# Patient Record
Sex: Male | Born: 1960 | Race: Black or African American | Hispanic: No | Marital: Married | Smoking: Never smoker
Health system: Southern US, Community
[De-identification: ages and names within clinical notes are randomized; demographics above are authoritative.]

## PROBLEM LIST (undated history)

## (undated) DIAGNOSIS — E119 Type 2 diabetes mellitus without complications: Secondary | ICD-10-CM

## (undated) HISTORY — DX: Type 2 diabetes mellitus without complications: E11.9

## (undated) HISTORY — PX: TONSILECTOMY, ADENOIDECTOMY, BILATERAL MYRINGOTOMY AND TUBES: SHX2538

---

## 2011-06-25 ENCOUNTER — Ambulatory Visit (INDEPENDENT_AMBULATORY_CARE_PROVIDER_SITE_OTHER): Payer: 59 | Admitting: Family Medicine

## 2011-06-25 ENCOUNTER — Encounter: Payer: Self-pay | Admitting: Family Medicine

## 2011-06-25 VITALS — BP 136/101 | HR 117 | Temp 98.0°F | Resp 20 | Ht 74.5 in | Wt 330.2 lb

## 2011-06-25 DIAGNOSIS — M109 Gout, unspecified: Secondary | ICD-10-CM | POA: Insufficient documentation

## 2011-06-25 DIAGNOSIS — E669 Obesity, unspecified: Secondary | ICD-10-CM

## 2011-06-25 DIAGNOSIS — Z130 Encounter for screening for diseases of the blood and blood-forming organs and certain disorders involving the immune mechanism: Secondary | ICD-10-CM

## 2011-06-25 DIAGNOSIS — I10 Essential (primary) hypertension: Secondary | ICD-10-CM

## 2011-06-25 DIAGNOSIS — E119 Type 2 diabetes mellitus without complications: Secondary | ICD-10-CM | POA: Insufficient documentation

## 2011-06-25 LAB — COMPREHENSIVE METABOLIC PANEL
AST: 18 U/L (ref 0–37)
Albumin: 4.6 g/dL (ref 3.5–5.2)
Alkaline Phosphatase: 64 U/L (ref 39–117)
BUN: 17 mg/dL (ref 6–23)
Creat: 0.9 mg/dL (ref 0.50–1.35)
Glucose, Bld: 302 mg/dL — ABNORMAL HIGH (ref 70–99)

## 2011-06-25 LAB — CBC
Hemoglobin: 15.4 g/dL (ref 13.0–17.0)
MCH: 27.6 pg (ref 26.0–34.0)
MCHC: 36.4 g/dL — ABNORMAL HIGH (ref 30.0–36.0)
MCV: 75.8 fL — ABNORMAL LOW (ref 78.0–100.0)
Platelets: 302 10*3/uL (ref 150–400)
RBC: 5.58 MIL/uL (ref 4.22–5.81)

## 2011-06-25 LAB — POCT GLYCOSYLATED HEMOGLOBIN (HGB A1C): Hemoglobin A1C: 10

## 2011-06-25 LAB — URIC ACID: Uric Acid, Serum: 6.7 mg/dL (ref 4.0–7.8)

## 2011-06-25 LAB — LIPID PANEL
HDL: 33 mg/dL — ABNORMAL LOW (ref >39)
LDL Cholesterol: 132 mg/dL — ABNORMAL HIGH (ref 0–99)
Total CHOL/HDL Ratio: 6.3 Ratio
Triglycerides: 221 mg/dL — ABNORMAL HIGH (ref <150)

## 2011-06-25 MED ORDER — LISINOPRIL-HYDROCHLOROTHIAZIDE 20-12.5 MG PO TABS: 1.0000 | ORAL_TABLET | Freq: Every day | ORAL | Status: DC

## 2011-06-25 MED ORDER — METFORMIN HCL 1000 MG PO TABS: 1000.0000 mg | ORAL_TABLET | Freq: Two times a day (BID) | ORAL | Status: DC

## 2011-06-25 NOTE — Patient Instructions (Signed)
Diabetes Meal Planning Guide The diabetes meal planning guide is a tool to help you plan your meals and snacks. It is important for people with diabetes to manage their blood glucose (sugar) levels. Choosing the right foods and the right amounts throughout your day will help control your blood glucose. Eating right can even help you improve your blood pressure and reach or maintain a healthy weight. CARBOHYDRATE COUNTING MADE EASY When you eat carbohydrates, they turn to sugar. This raises your blood glucose level. Counting carbohydrates can help you control this level so you feel better. When you plan your meals by counting carbohydrates, you can have more flexibility in what you eat and balance your medicine with your food intake. Carbohydrate counting simply means adding up the total amount of carbohydrate grams in your meals and snacks. Try to eat about the same amount at each meal. Foods with carbohydrates are listed below. Each portion below is 1 carbohydrate serving or 15 grams of carbohydrates. Ask your dietician how many grams of carbohydrates you should eat at each meal or snack. Grains and Starches  1 slice bread.    English muffin or hotdog/hamburger bun.    cup cold cereal (unsweetened).   ? cup cooked pasta or rice.    cup starchy vegetables (corn, potatoes, peas, beans, winter squash).   1 tortilla (6 inches).    bagel.   1 waffle or pancake (size of a CD).    cup cooked cereal.   4 to 6 small crackers.  *Whole grain is recommended. Fruit  1 cup fresh unsweetened berries, melon, papaya, pineapple.   1 small fresh fruit.    banana or mango.    cup fruit juice (4 oz unsweetened).    cup canned fruit in natural juice or water.   2 tbs dried fruit.   12 to 15 grapes or cherries.  Milk and Yogurt  1 cup fat-free or 1% milk.   1 cup soy milk.   6 oz light yogurt with sugar-free sweetener.   6 oz low-fat soy yogurt.   6 oz plain yogurt.   Vegetables  1 cup raw or  cup cooked is counted as 0 carbohydrates or a "free" food.   If you eat 3 or more servings at 1 meal, count them as 1 carbohydrate serving.  Other Carbohydrates   oz chips or pretzels.    cup ice cream or frozen yogurt.    cup sherbet or sorbet.   2 inch square cake, no frosting.   1 tbs honey, sugar, jam, jelly, or syrup.   2 small cookies.   3 squares of graham crackers.   3 cups popcorn.   6 crackers.   1 cup broth-based soup.   Count 1 cup casserole or other mixed foods as 2 carbohydrate servings.   Foods with less than 20 calories in a serving may be counted as 0 carbohydrates or a "free" food.  You may want to purchase a book or computer software that lists the carbohydrate gram counts of different foods. In addition, the nutrition facts panel on the labels of the foods you eat are a good source of this information. The label will tell you how big the serving size is and the total number of carbohydrate grams you will be eating per serving. Divide this number by 15 to obtain the number of carbohydrate servings in a portion. Remember, 1 carbohydrate serving equals 15 grams of carbohydrate. SERVING SIZES Measuring foods and serving sizes helps you   make sure you are getting the right amount of food. The list below tells how big or small some common serving sizes are.  1 oz.........4 stacked dice.   3 oz........Marland KitchenDeck of cards.   1 tsp.......Marland KitchenTip of little finger.   1 tbs......Marland KitchenMarland KitchenThumb.   2 tbs.......Marland KitchenGolf ball.    cup......Marland KitchenHalf of a fist.   1 cup.......Marland KitchenA fist.  SAMPLE DIABETES MEAL PLAN Below is a sample meal plan that includes foods from the grain and starches, dairy, vegetable, fruit, and meat groups. A dietician can individualize a meal plan to fit your calorie needs and tell you the number of servings needed from each food group. However, controlling the total amount of carbohydrates in your meal or snack is more important than  making sure you include all of the food groups at every meal. You may interchange carbohydrate containing foods (dairy, starches, and fruits). The meal plan below is an example of a 2000 calorie diet using carbohydrate counting. This meal plan has 17 carbohydrate servings. Breakfast  1 cup oatmeal (2 carb servings).    cup light yogurt (1 carb serving).   1 cup blueberries (1 carb serving).    cup almonds.  Snack  1 large apple (2 carb servings).   1 low-fat string cheese stick.  Lunch  Chicken breast salad.   1 cup spinach.    cup chopped tomatoes.   2 oz chicken breast, sliced.   2 tbs low-fat [place] dressing.   12 whole-wheat crackers (2 carb servings).   12 to 15 grapes (1 carb serving).   1 cup low-fat milk (1 carb serving).  Snack  1 cup carrots.    cup hummus (1 carb serving).  Dinner  3 oz broiled salmon.   1 cup brown rice (3 carb servings).  Snack  1  cups steamed broccoli (1 carb serving) drizzled with 1 tsp olive oil and lemon juice.   1 cup light pudding (2 carb servings).  DIABETES MEAL PLANNING WORKSHEET Your dietician can use this worksheet to help you decide how many servings of foods and what types of foods are right for you.  BREAKFAST Food Group and Servings / Carb Servings Grain/Starches __________________________________ Dairy __________________________________________ Vegetable ______________________________________ Fruit ___________________________________________ Meat __________________________________________ Fat ____________________________________________ LUNCH Food Group and Servings / Carb Servings Grain/Starches ___________________________________ Dairy ___________________________________________ Fruit ____________________________________________ Meat ___________________________________________ Fat _____________________________________________ Laural Golden Food Group and Servings / Carb Servings Grain/Starches  ___________________________________ Dairy ___________________________________________ Fruit ____________________________________________ Meat ___________________________________________ Fat _____________________________________________ SNACKS Food Group and Servings / Carb Servings Grain/Starches ___________________________________ Dairy ___________________________________________ Vegetable _______________________________________ Fruit ____________________________________________ Meat ___________________________________________ Fat _____________________________________________ DAILY TOTALS Starches _________________________ Vegetable ________________________ Fruit ____________________________ Dairy ____________________________ Meat ____________________________ Fat ______________________________ Document Released: 11/21/2004 Document Revised: 02/13/2011 Document Reviewed: 10/02/2008 [employer] Patient Information 2012 [place], [place].    Obesity Obesity is defined as having a body mass index (BMI) of 30 or more. To calculate your BMI divide your weight in pounds by your height in inches squared and multiply that product by 703. Major illnesses resulting from long-term obesity include:  Stroke.   Heart disease.   Diabetes.   Many cancers.   Arthritis.  Obesity also complicates recovery from many other medical problems.  CAUSES   A history of obesity in your parents.   Thyroid hormone imbalance.   Environmental factors such as excess calorie intake and physical inactivity.  TREATMENT  A healthy weight loss program includes:  A calorie restricted diet based on individual calorie needs.   Increased physical activity (exercise).  An exercise program is  just as important as the right low-calorie diet.  Weight-loss medicines should be used only under the supervision of your physician. These medicines help, but only if they are used with diet and exercise programs. Medicines  can have side effects including nervousness, nausea, abdominal pain, diarrhea, headache, drowsiness, and depression.  An unhealthy weight loss program includes:  Fasting.   Fad diets.   Supplements and drugs.  These choices do not succeed in long-term weight control.  HOME CARE INSTRUCTIONS  To help you make the needed dietary changes:   Exercise and perform physical activity as directed by your caregiver.   Keep a daily record of everything you eat. There are many free websites to help you with this. It may be helpful to measure your foods so you can determine if you are eating the correct portion sizes.   Use low-calorie cookbooks or take special cooking classes.   Avoid alcohol. Drink more water and drinks with no calories.   Take vitamins and supplements only as recommended by your caregiver.   Weight loss support groups, Registered Dieticians, counselors, and stress reduction education can also be very helpful.  Document Released: 04/03/2004 Document Revised: 02/13/2011 Document Reviewed: 01/31/2007 [facility] Patient Information 2012 [place], [place].

## 2011-06-28 ENCOUNTER — Encounter: Payer: Self-pay | Admitting: Family Medicine

## 2011-06-28 DIAGNOSIS — E669 Obesity, unspecified: Secondary | ICD-10-CM | POA: Insufficient documentation

## 2011-06-28 NOTE — Progress Notes (Signed)
  Subjective:    Patient ID: Spencer Woodward, male    DOB: February 13, 1961, 51 y.o.   MRN: [id number]  HPI   This 51 y.o. AA male has uncontrolled HTN and Diabetes (treated with oral agent). 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 His medications at that time were Lisinopril 10 mg  and Metformin 500 mg. Pt admits that he has not been compliant with medications and has been taking  them daily only for the last week or so. He denies CP, HA, palpitations, dizziness, numbness or weakness.    Review of Systems  Constitutional: Negative.   Respiratory: Negative for cough, chest tightness and shortness of breath.   Cardiovascular: Negative.   Musculoskeletal: Negative.   Neurological: Negative.        Objective:   Physical Exam  Vitals reviewed. Constitutional: He is oriented to person, place, and time. He appears well-developed and well-nourished. No distress.  HENT:  Head: Normocephalic and atraumatic.  Right Ear: External ear normal.  Left Ear: External ear normal.  Mouth/Throat: Oropharynx is clear and moist.  Eyes: Conjunctivae and EOM are normal. Pupils are equal, round, and reactive to light. No scleral icterus.  Neck: Normal range of motion. Neck supple. No thyromegaly present.  Cardiovascular: Regular rhythm and normal heart sounds.  Exam reveals no gallop.   No murmur heard.      Sinus tachycardia   Pulmonary/Chest: Effort normal and breath sounds normal. No respiratory distress. He has no wheezes.  Abdominal: Soft. Bowel sounds are normal. He exhibits no mass. There is no tenderness. There is no guarding.  Musculoskeletal: Normal range of motion. He exhibits no edema.  Lymphadenopathy:    He has no cervical adenopathy.  Neurological: He is alert and oriented to person, place, and time. He has normal reflexes. No cranial nerve deficit.  Skin: Skin is warm and dry.  Psychiatric: He has a normal mood and affect. His behavior is normal.       Slightly  agitated or nervous and easily distracted    Hb A1C= 10.0      Assessment & Plan:   1. HTN (hypertension)  Comprehensive metabolic panel, TSH  2. Type II or unspecified type diabetes mellitus without mention of complication, uncontrolled  Lipid panel; improve nutrition and lifestyle/ exercise habits  3. Screening for deficiency anemia  CBC  4. Gout  Uric Acid  5. Obesity, Class III, BMI 40-49.9 (morbid obesity)  Vitamin D, 25-hydroxy

## 2011-06-29 NOTE — Progress Notes (Signed)
Quick Note:  Please call pt and advise that the following labs are abnormal...  Blood sugar is high (pt is Diabetic) so important to take meds as prescribed and get with nutrition and exercise program. Vit D level is a little low; get OTC Vit D3 supplememt 1000 IU and take 1 capsule every day. Lipids are high so getting Diabetes under good control is key to getting these numbers down . HDL ("good") cholesterol in too low so regular exercise will help raise it. These numbers will be rechecked in 2-3 months.  Copy to pt. ______

## 2011-06-30 ENCOUNTER — Encounter: Payer: Self-pay | Admitting: Internal Medicine

## 2011-06-30 ENCOUNTER — Other Ambulatory Visit: Payer: Self-pay | Admitting: Internal Medicine

## 2011-06-30 DIAGNOSIS — E119 Type 2 diabetes mellitus without complications: Secondary | ICD-10-CM

## 2011-06-30 DIAGNOSIS — I10 Essential (primary) hypertension: Secondary | ICD-10-CM

## 2011-06-30 MED ORDER — LISINOPRIL-HYDROCHLOROTHIAZIDE 20-12.5 MG PO TABS: 2.0000 | ORAL_TABLET | Freq: Every day | ORAL | Status: DC

## 2011-08-27 ENCOUNTER — Encounter: Payer: Self-pay | Admitting: Family Medicine

## 2011-08-27 ENCOUNTER — Ambulatory Visit (INDEPENDENT_AMBULATORY_CARE_PROVIDER_SITE_OTHER): Payer: Self-pay | Admitting: Family Medicine

## 2011-08-27 VITALS — BP 135/93 | HR 75 | Temp 98.1°F | Resp 16 | Ht 75.0 in | Wt 322.4 lb

## 2011-08-27 DIAGNOSIS — I10 Essential (primary) hypertension: Secondary | ICD-10-CM

## 2011-08-27 DIAGNOSIS — E66813 Obesity, class 3: Secondary | ICD-10-CM

## 2011-08-27 DIAGNOSIS — IMO0001 Reserved for inherently not codable concepts without codable children: Secondary | ICD-10-CM

## 2011-08-27 DIAGNOSIS — B353 Tinea pedis: Secondary | ICD-10-CM

## 2011-08-27 NOTE — Patient Instructions (Addendum)
Athlete's Foot Athlete's foot (tinea pedis) is a fungal infection of the skin on the feet. It often occurs on the skin between the toes or underneath the toes. It can also occur on the soles of the feet. Athlete's foot is more likely to occur in hot, humid weather. Not washing your feet or changing your socks often enough can contribute to athlete's foot. The infection can spread from person to person (contagious). CAUSES Athlete's foot is caused by a fungus. This fungus thrives in warm, moist places. Most people get athlete's foot by sharing shower stalls, towels, and wet floors with an infected person. People with weakened immune systems, including those with diabetes, may be more likely to get athlete's foot. SYMPTOMS   Itchy areas between the toes or on the soles of the feet.   White, flaky, or scaly areas between the toes or on the soles of the feet.   Tiny, intensely itchy blisters between the toes or on the soles of the feet.   Tiny cuts on the skin. These cuts can develop a bacterial infection.   Thick or discolored toenails.  DIAGNOSIS  Your caregiver can usually tell what the problem is by doing a physical exam. Your caregiver may also take a skin sample from the rash area. The skin sample may be examined under a microscope, or it may be tested to see if fungus will grow in the sample. A sample may also be taken from your toenail for testing. TREATMENT  Over-the-counter and prescription medicines can be used to kill the fungus. These medicines are available as powders or creams. Your caregiver can suggest medicines for you. Fungal infections respond slowly to treatment. You may need to continue using your medicine for several weeks. PREVENTION   Do not share towels.   Wear sandals in wet areas, such as shared locker rooms and shared showers.   Keep your feet dry. Wear shoes that allow air to circulate. Wear cotton or wool socks.  HOME CARE INSTRUCTIONS   Take medicines as  directed by your caregiver. Do not use steroid creams on athlete's foot.   Keep your feet clean and cool. Wash your feet daily and dry them thoroughly, especially between your toes.   Change your socks every day. Wear cotton or wool socks. In hot climates, you may need to change your socks 2 to 3 times per day.   Wear sandals or canvas tennis shoes with good air circulation.   If you have blisters, soak your feet in Burow's solution or Epsom salts for 20 to 30 minutes, 2 times a day to dry out the blisters. Make sure you dry your feet thoroughly afterward.  SEEK MEDICAL CARE IF:   You have a fever.   You have swelling, soreness, warmth, or redness in your foot.   You are not getting better after 7 days of treatment.   You are not completely cured after 30 days.   You have any problems caused by your medicines.  MAKE SURE YOU:   Understand these instructions.   Will watch your condition.   Will get help right away if you are not doing well or get worse.  Document Released: 02/22/2000 Document Revised: 02/13/2011 Document Reviewed: 12/13/2010 [facility] Patient Information 2012 [place], [place]     .Vitamin D Deficiency  Not having enough vitamin D is called a deficiency. Your body needs this vitamin to keep your bones strong and healthy. Having too little of it can make your bones soft or  can cause other health problems.  HOME CARE  Take all vitamins, herbs, or nutrition drinks (supplements) as told by your doctor.   Have your blood tested 2 months after taking vitamins, herbs, or nutrition drinks.   Eat foods that have vitamin D. This includes:   Dairy products, cereals, or juices with added vitamin D. Check the label.   Fatty fish like salmon or trout.   Eggs.   Oysters.   Go outside for 10 to 15 minutes when the sun is shining. Do this 3 times a week. Do not do this if you have skin cancer.   Do not use tanning beds.   Stay at a healthy weight. Lose weight if  needed.   Keep all doctor visits as told.  GET HELP IF:  You have questions.   You continue to have problems.   You feel sick to your stomach (nauseous) or throw up (vomit).   You cannot go poop (constipated).   You feel confused.   You have severe belly (abdominal) or back pain.  MAKE SURE YOU:  Understand these instructions.   Will watch your condition.   Will get help right away if you are not doing well or get worse.  Document Released: 02/13/2011 Document Reviewed: 02/11/2011 [facility] Patient Information 2012 [place], [place].    Get over-the-counter Vitamin D 2000 IU   Take 1 capsule daily and continue daily sun exposure

## 2011-08-27 NOTE — Progress Notes (Signed)
  Subjective:    Patient ID: Peggye Form, male    DOB: 11-17-1960, 51 y.o.   MRN: [id number]  HPI   This pt is Type II Diabetic who has not been compliant with medications. He was advised to increase  Lisinopril- HCTZ to 2 tabs every AM; he has been taking it 2x daily and often missing 2nd dose as well as    missing 2nd dose of Metformin. He is managing nutrition better and has lost 8 pounds. He has no symptoms  of hypoglycemia. No FSBS daily. Denies polyuria or polydipsia. No paresthesias in hands or feet.  Does have scaly itchy rash on both feet; took Benadryl without relief. No topicals.    Review of Systems  Constitutional: Negative.   Eyes: Negative.   Respiratory: Negative for cough, chest tightness and shortness of breath.   Cardiovascular: Negative.   Neurological: Negative for dizziness, weakness, numbness and headaches.       Objective:   Physical Exam  Nursing note and vitals reviewed. Constitutional: He is oriented to person, place, and time. He appears well-developed and well-nourished. No distress.  HENT:  Head: Normocephalic.  Right Ear: External ear normal.  Mouth/Throat: Oropharynx is clear and moist.  Eyes: Conjunctivae and EOM are normal. Pupils are equal, round, and reactive to light. No scleral icterus.  Neck: Neck supple. No thyromegaly present.  Cardiovascular: Normal rate, regular rhythm and normal heart sounds.   Pulmonary/Chest: Breath sounds normal. No respiratory distress.  Musculoskeletal: Normal range of motion. He exhibits no edema and no tenderness.  Neurological: He is alert and oriented to person, place, and time. No cranial nerve deficit. Coordination normal.  Skin: Skin is warm. Rash noted.       Bilat feet: scaly rash on plantar aspect and sides of feet with dried papules          Assessment & Plan:   1. Type II or unspecified type diabetes mellitus without mention of complication, uncontrolled - Reminded to take Metformin twice daily  without fail; Good nutrition important  2. HTN, goal below 130/80: emphasized compliance with medication- take both tabs every AM so  doses not missed  3. Obesity, Class III, BMI 40-49.9 (morbid obesity)    Encouraged continued weight reduction- pt's goal is another 10 pounds by next visit  4. Tinea pedis     Get OTC product for "Athlete's foot"; foot care discussed

## 2011-09-02 ENCOUNTER — Emergency Department: Payer: Self-pay | Admitting: Emergency Medicine

## 2011-09-02 LAB — CBC
HGB: 13.5 g/dL (ref 13.0–18.0)
MCH: 27.6 pg (ref 26.0–34.0)
Platelet: 267 10*3/uL (ref 150–440)
WBC: 11.3 10*3/uL — ABNORMAL HIGH (ref 3.8–10.6)

## 2011-09-02 LAB — COMPREHENSIVE METABOLIC PANEL
Albumin: 4 g/dL (ref 3.4–5.0)
Alkaline Phosphatase: 84 U/L (ref 50–136)
Anion Gap: 8 (ref 7–16)
BUN: 20 mg/dL — ABNORMAL HIGH (ref 7–18)
Calcium, Total: 8.9 mg/dL (ref 8.5–10.1)
Chloride: 103 mmol/L (ref 98–107)
Co2: 26 mmol/L (ref 21–32)
EGFR (African American): >60
Osmolality: 281 (ref 275–301)
SGOT(AST): 38 U/L — ABNORMAL HIGH (ref 15–37)

## 2011-10-29 ENCOUNTER — Ambulatory Visit: Payer: 59 | Admitting: Family Medicine

## 2011-11-23 ENCOUNTER — Other Ambulatory Visit: Payer: Self-pay | Admitting: Internal Medicine

## 2011-12-16 ENCOUNTER — Emergency Department: Payer: Self-pay | Admitting: Emergency Medicine

## 2011-12-16 LAB — COMPREHENSIVE METABOLIC PANEL
Albumin: 3.8 g/dL (ref 3.4–5.0)
Anion Gap: 12 (ref 7–16)
Bilirubin,Total: 0.5 mg/dL (ref 0.2–1.0)
Calcium, Total: 8.9 mg/dL (ref 8.5–10.1)
Chloride: 101 mmol/L (ref 98–107)
Co2: 24 mmol/L (ref 21–32)
Creatinine: 0.98 mg/dL (ref 0.60–1.30)
EGFR (African American): >60
EGFR (Non-African Amer.): >60
Osmolality: 297 (ref 275–301)
Sodium: 137 mmol/L (ref 136–145)

## 2011-12-16 LAB — URINALYSIS, COMPLETE
Bacteria: NONE SEEN
Bilirubin,UR: NEGATIVE
Blood: NEGATIVE
Glucose,UR: >=500 mg/dL (ref 0–75)
Leukocyte Esterase: NEGATIVE
Nitrite: NEGATIVE
Ph: 5 (ref 4.5–8.0)
Specific Gravity: 1.028 (ref 1.003–1.030)
WBC UR: <1 /HPF (ref 0–5)

## 2011-12-16 LAB — CBC
HCT: 43.9 % (ref 40.0–52.0)
HGB: 15.3 g/dL (ref 13.0–18.0)
MCH: 27.6 pg (ref 26.0–34.0)
MCHC: 34.9 g/dL (ref 32.0–36.0)
Platelet: 214 10*3/uL (ref 150–440)
RDW: 13.2 % (ref 11.5–14.5)

## 2012-04-02 ENCOUNTER — Telehealth: Payer: Self-pay

## 2012-04-02 ENCOUNTER — Ambulatory Visit (INDEPENDENT_AMBULATORY_CARE_PROVIDER_SITE_OTHER): Payer: Self-pay | Admitting: Emergency Medicine

## 2012-04-02 VITALS — BP 181/120 | HR 86 | Temp 97.9°F | Resp 16 | Ht 75.5 in | Wt 330.0 lb

## 2012-04-02 DIAGNOSIS — Z9119 Patient's noncompliance with other medical treatment and regimen: Secondary | ICD-10-CM

## 2012-04-02 DIAGNOSIS — IMO0001 Reserved for inherently not codable concepts without codable children: Secondary | ICD-10-CM

## 2012-04-02 DIAGNOSIS — E782 Mixed hyperlipidemia: Secondary | ICD-10-CM

## 2012-04-02 DIAGNOSIS — Z91199 Patient's noncompliance with other medical treatment and regimen due to unspecified reason: Secondary | ICD-10-CM

## 2012-04-02 DIAGNOSIS — E1165 Type 2 diabetes mellitus with hyperglycemia: Secondary | ICD-10-CM

## 2012-04-02 DIAGNOSIS — I10 Essential (primary) hypertension: Secondary | ICD-10-CM

## 2012-04-02 LAB — POCT GLYCOSYLATED HEMOGLOBIN (HGB A1C)

## 2012-04-02 MED ORDER — LISINOPRIL-HYDROCHLOROTHIAZIDE 20-12.5 MG PO TABS: 2.0000 | ORAL_TABLET | Freq: Every day | ORAL | Status: DC

## 2012-04-02 MED ORDER — ATORVASTATIN CALCIUM 20 MG PO TABS: 20.0000 mg | ORAL_TABLET | Freq: Every day | ORAL | Status: DC

## 2012-04-02 MED ORDER — LOVASTATIN 20 MG PO TABS: 20.0000 mg | ORAL_TABLET | Freq: Every day | ORAL | Status: DC

## 2012-04-02 MED ORDER — METFORMIN HCL ER (OSM) 1000 MG PO TB24: 2000.0000 mg | ORAL_TABLET | Freq: Every day | ORAL | Status: DC

## 2012-04-02 MED ORDER — METFORMIN HCL ER (OSM) 500 MG PO TB24: ORAL_TABLET | ORAL | Status: DC

## 2012-04-02 NOTE — Telephone Encounter (Signed)
Pharmacist reports that Metformin ER 500 mg is a lot less expensive and requested change to 4 tablets of the 500 mg Q am. Also Lovastatin is a lot less expensive than Lipitor 20. I asked Dr Dareen Piano if it is all right to make these changes and he approved. Gave pharmacist order to change both and entering in EPIC for the records. Notified pt of changes.

## 2012-04-02 NOTE — Progress Notes (Signed)
[facility] [address], [place] [address] [phone]  Date:  04/02/2012   Name:  Spencer Woodward   DOB:  09-24-1960   MRN:  [id number]  PCP:  No primary provider on file.    Chief Complaint: Medication Refill and Follow-up   History of Present Illness:  [name] is a 52 y.o. very pleasant male patient who presents with the following:  Non compliant type II diabetic with hypertension and hyperglycemia.  Ran out of antihypertensives two weeks ago and took his last metformin today.  Not fasting, ate breakfast at 0500.  Denies being symptomatic.    Patient Active Problem List  Diagnosis  . HTN (hypertension)  . Type II or unspecified type diabetes mellitus without mention of complication, uncontrolled  . Gout  . Obesity    Past Medical History  Diagnosis Date  . Diabetes mellitus without complication     History reviewed. No pertinent past surgical history.  History  Substance Use Topics  . Smoking status: Never Smoker   . Smokeless tobacco: Not on file  . Alcohol Use: No     Comment: social    Family History  Problem Relation Age of Onset  . Diabetes Mother   . Heart disease Father   . Alcohol abuse Father     No Known Allergies  Medication list has been reviewed and updated.  Current Outpatient Prescriptions on File Prior to Visit  Medication Sig Dispense Refill  . lisinopril-hydrochlorothiazide (PRINZIDE,ZESTORETIC) 20-12.5 MG per tablet Take 2 tablets by mouth daily.  60 tablet  3  . metformin (FORTAMET) 1000 MG (OSM) 24 hr tablet TAKE 2 TABLETS (1000 MG) BY MOUTH TWICE DAILY  120 tablet  0  . metFORMIN (GLUCOPHAGE) 1000 MG tablet Take 1 tablet (1,000 mg total) by mouth 2 (two) times daily with a meal.  60 tablet  3  . Multiple Vitamin (MULTIVITAMIN) tablet Take 1 tablet by mouth daily.        Review of Systems:  As per HPI, otherwise negative.    Physical Examination: Filed Vitals:   04/02/12 1301  BP: 181/120  Pulse:  86  Temp: 97.9 F (36.6 C)  Resp: 16   Filed Vitals:   04/02/12 1301  Height: 6' 3.5" (1.918 m)  Weight: 330 lb (149.687 kg)   Body mass index is 40.70 kg/(m^2). Ideal Body Weight: Weight in (lb) to have BMI = 25: 202.3   GEN: WDWN, NAD, Non-toxic, A & O x 3 HEENT: Atraumatic, Normocephalic. Neck supple. No masses, No LAD. Ears and Nose: No external deformity. CV: RRR, No M/G/R. No JVD. No thrill. No extra heart sounds. PULM: CTA B, no wheezes, crackles, rhonchi. No retractions. No resp. distress. No accessory muscle use. ABD: S, NT, ND, +BS. No rebound. No HSM. EXTR: No c/c/e NEURO Normal gait.  PSYCH: Normally interactive. Conversant. Not depressed or anxious appearing.  Calm demeanor.    Assessment and Plan: Hypertension NIDDM Hyperlipidemia hypotestosteronism Add victoza Add androgel Follow up in two weeks  Carmelina Dane, MD

## 2012-04-03 LAB — LIPID PANEL
LDL Cholesterol: 115 mg/dL — ABNORMAL HIGH (ref 0–99)
VLDL: 35 mg/dL (ref 0–40)

## 2012-04-03 LAB — COMPREHENSIVE METABOLIC PANEL
ALT: 34 U/L (ref 0–53)
Albumin: 4.5 g/dL (ref 3.5–5.2)
Alkaline Phosphatase: 61 U/L (ref 39–117)
Potassium: 4.3 mEq/L (ref 3.5–5.3)
Sodium: 142 mEq/L (ref 135–145)
Total Bilirubin: 0.4 mg/dL (ref 0.3–1.2)
Total Protein: 7.7 g/dL (ref 6.0–8.3)

## 2012-04-03 LAB — TESTOSTERONE: Testosterone: 219.52 ng/dL — ABNORMAL LOW (ref 300–890)

## 2012-04-03 LAB — PSA: PSA: 0.83 ng/mL (ref <=4.00)

## 2012-04-03 MED ORDER — TESTOSTERONE 20.25 MG/1.25GM (1.62%) TD GEL: 2.0000 "application " | Freq: Every day | TRANSDERMAL | Status: DC

## 2012-04-06 ENCOUNTER — Telehealth: Payer: Self-pay

## 2012-04-06 NOTE — Telephone Encounter (Signed)
Patient went to [facility] to pick up rx but it costs $400 he was hoping if he can get a generic brand for less because he has no insurance. Please call back if possible.

## 2012-04-07 NOTE — Telephone Encounter (Signed)
Called patient, there is not a generic form of testosterone gel. He may want to apply for financial assistance through the drug company. He will do this.

## 2012-05-15 ENCOUNTER — Ambulatory Visit (INDEPENDENT_AMBULATORY_CARE_PROVIDER_SITE_OTHER): Payer: Self-pay | Admitting: Family Medicine

## 2012-05-15 VITALS — BP 120/80 | HR 111 | Temp 97.5°F | Resp 18 | Ht 74.0 in | Wt 310.6 lb

## 2012-05-15 DIAGNOSIS — Z79899 Other long term (current) drug therapy: Secondary | ICD-10-CM

## 2012-05-15 DIAGNOSIS — E785 Hyperlipidemia, unspecified: Secondary | ICD-10-CM | POA: Insufficient documentation

## 2012-05-15 DIAGNOSIS — I10 Essential (primary) hypertension: Secondary | ICD-10-CM

## 2012-05-15 DIAGNOSIS — IMO0001 Reserved for inherently not codable concepts without codable children: Secondary | ICD-10-CM

## 2012-05-15 MED ORDER — METFORMIN HCL 1000 MG PO TABS: 1000.0000 mg | ORAL_TABLET | Freq: Two times a day (BID) | ORAL | Status: AC

## 2012-05-15 MED ORDER — GLYBURIDE 5 MG PO TABS: 5.0000 mg | ORAL_TABLET | Freq: Two times a day (BID) | ORAL | Status: DC

## 2012-05-15 MED ORDER — LISINOPRIL-HYDROCHLOROTHIAZIDE 20-12.5 MG PO TABS: 2.0000 | ORAL_TABLET | Freq: Every day | ORAL | Status: DC

## 2012-05-15 MED ORDER — LOVASTATIN 20 MG PO TABS: 20.0000 mg | ORAL_TABLET | Freq: Every day | ORAL | Status: DC

## 2012-05-15 NOTE — Patient Instructions (Signed)
Diabetes, Eating Away From Home Sometimes, you might eat in a restaurant or have meals that are prepared by someone else. You can enjoy eating out. However, the portions in restaurants may be much larger than needed. Listed below are some ideas to help you choose foods that will keep your blood glucose (sugar) in better control.  TIPS FOR EATING OUT  Know your meal plan and how many carbohydrate servings you should have at each meal. You may wish to carry a copy of your meal plan in your purse or wallet. Learn the foods included in each food group.  Make a list of restaurants near you that offer healthy choices. Take a copy of the carry-out menus to see what they offer. Then, you can plan what you will order ahead of time.  Become familiar with serving sizes by practicing them at home using measuring cups and spoons. Once you learn to recognize portion sizes, you will be able to correctly estimate the amount of total carbohydrate you are allowed to eat at the restaurant. Ask for a takeout box if the portion is more than you should have. When your food comes, leave the amount you should have on the plate, and put the rest in the takeout box before you start eating.  Plan ahead if your mealtime will be different from usual. Check with your caregiver to find out how to time meals and medicine if you are taking insulin.  Avoid high-fat foods, such as fried foods, cream sauces, high-fat salad dressings, or any added butter or margarine.  Do not be afraid to ask questions. Ask your server about the portion size, cooking methods, ingredients and if items can be substituted. Restaurants do not list all available items on the menu. You can ask for your main entree to be prepared using skim milk, oil instead of butter or margarine, and without gravy or sauces. Ask your waiter or waitress to serve salad dressings, gravy, sauces, margarine, and sour cream on the side. You can then add the amount your meal plan  suggests.  Add more vegetables whenever possible.  Avoid items that are labeled "jumbo," "giant," "deluxe," or "supersized."  You may want to split an entre with someone and order an extra side salad.  Watch for hidden calories in foods like croutons, bacon, or cheese.  Ask your server to take away the bread basket or chips from your table.  Order a dinner salad as an appetizer. You can eat most foods served in a restaurant. Some foods are better choices than others. Breads and Starches  Recommended: All kinds of bread (wheat, rye, white, oatmeal, Italian, French, raisin), hard or soft dinner rolls, frankfurter or hamburger buns, small bagels, small corn or whole-wheat flour tortillas.  Avoid: Frosted or glazed breads, butter rolls, egg or cheese breads, croissants, sweet rolls, pastries, coffee cake, glazed or frosted doughnuts, muffins. Crackers  Recommended: Animal crackers, graham, rye, saltine, oyster, and matzoth crackers. Bread sticks, melba toast, rusks, pretzels, popcorn (without fat), zwieback toast.  Avoid: High-fat snack crackers or chips. Buttered popcorn. Cereals  Recommended: Hot and cold cereals. Whole grains such as oatmeal or shredded wheat are good choices.  Avoid: Sugar-coated or granola type cereals. Potatoes/Pasta/Rice/Beans  Recommended: Order baked, boiled, or mashed potatoes, rice or noodles without added fat, whole beans. Order gravies, butter, margarine, or sauces on the side so you can control the amount you add.  Avoid: Hash browns or fried potatoes. Potatoes, pasta, or rice prepared with cream or   cheese sauce. Potato or pasta salads prepared with large amounts of dressing. Fried beans or fried rice. Vegetables  Recommended: Order steamed, baked, boiled, or stewed vegetables without sauces or extra fat. Ask that sauce be served on the side. If vegetables are not listed on the menu, ask what is available.  Avoid: Vegetables prepared with cream,  butter, or cheese sauce. Fried vegetables. Salad Bars  Recommended: Many of the vegetables at a salad bar are considered "free." Use lemon juice, vinegar, or low-calorie salad dressing (fewer than 20 calories per serving) as "free" dressings for your salad. Look for salad bar ingredients that have no added fat or sugar such as tomatoes, lettuce, cucumbers, broccoli, carrots, onions, and mushrooms.  Avoid: Prepared salads with large amounts of dressing, such as coleslaw, caesar salad, macaroni salad, bean salad, or carrot salad. Fruit  Recommended: Eat fresh fruit or fresh fruit salad without added dressing. A salad bar often offers fresh fruit choices, but canned fruit at a restaurant is usually packed in sugar or syrup.  Avoid: Sweetened canned or frozen fruits, plain or sweetened fruit juice. Fruit salads with dressing, sour cream, or sugar added to them. Meat and Meat Substitutes  Recommended: Order broiled, baked, roasted, or grilled meat, poultry, or fish. Trim off all visible fat. Do not eat the skin of poultry. The size stated on the menu is the raw weight. Meat shrinks by  in cooking (for example, 4 oz raw equals 3 oz cooked meat).  Avoid: Deep-fat fried meat, poultry, or fish. Breaded meats. Eggs  Recommended: Order soft, hard-cooked, poached, or scrambled eggs. Omelets may be okay, depending on what ingredients are added. Egg substitutes are also a good choice.  Avoid: Fried eggs, eggs prepared with cream or cheese sauce. Milk  Recommended: Order low-fat or fat-free milk according to your meal plan. Plain, nonfat yogurt or flavored yogurt with no sugar added may be used as a substitute for milk. Soy milk may also be used.  Avoid: Milk shakes or sweetened milk beverages. Soups and Combination Foods  Recommended: Clear broth or consomm are "free" foods and may be used as an appetizer. Broth-based soups with fat removed count as a starch serving and are preferred over cream  soups. Soups made with beans or split peas may be eaten but count as a starch.  Avoid: Fatty soups, soup made with cream, cheese soup. Combination foods prepared with excessive amounts of fat or with cream or cheese sauces. Desserts and Sweets  Recommended: Ask for fresh fruit. Sponge or angel food cake without icing, ice milk, no sugar added ice cream, sherbet, or frozen yogurt may fit into your meal plan occasionally.  Avoid: Pastries, puddings, pies, cakes with icing, custard, gelatin desserts. Fats and Oils  Recommended: Choose healthy fats such as olive oil, canola oil, or tub margarine, reduced fat or fat-free sour cream, cream cheese, avocado, or nuts.  Avoid: Any fats in excess of your allowed portion. Deep-fried foods or any food with a large amount of fat. Note: Ask for all fats to be served on the side, and limit your portion sizes according to your meal plan. Document Released: 02/24/2005 Document Revised: 05/19/2011 Document Reviewed: 09/14/2008 [facility] Patient Information 2013 [place], [place]. Diabetes Meal Planning Guide The diabetes meal planning guide is a tool to help you plan your meals and snacks. It is important for people with diabetes to manage their blood glucose (sugar) levels. Choosing the right foods and the right amounts throughout your day will help  control your blood glucose. Eating right can even help you improve your blood pressure and reach or maintain a healthy weight. CARBOHYDRATE COUNTING MADE EASY When you eat carbohydrates, they turn to sugar. This raises your blood glucose level. Counting carbohydrates can help you control this level so you feel better. When you plan your meals by counting carbohydrates, you can have more flexibility in what you eat and balance your medicine with your food intake. Carbohydrate counting simply means adding up the total amount of carbohydrate grams in your meals and snacks. Try to eat about the same amount at each meal. Foods  with carbohydrates are listed below. Each portion below is 1 carbohydrate serving or 15 grams of carbohydrates. Ask your dietician how many grams of carbohydrates you should eat at each meal or snack. Grains and Starches  1 slice bread.   English muffin or hotdog/hamburger bun.   cup cold cereal (unsweetened).   cup cooked pasta or rice.   cup starchy vegetables (corn, potatoes, peas, beans, winter squash).  1 tortilla (6 inches).   bagel.  1 waffle or pancake (size of a CD).   cup cooked cereal.  4 to 6 small crackers. *Whole grain is recommended. Fruit  1 cup fresh unsweetened berries, melon, papaya, pineapple.  1 small fresh fruit.   banana or mango.   cup fruit juice (4 oz unsweetened).   cup canned fruit in natural juice or water.  2 tbs dried fruit.  12 to 15 grapes or cherries. Milk and Yogurt  1 cup fat-free or 1% milk.  1 cup soy milk.  6 oz light yogurt with sugar-free sweetener.  6 oz low-fat soy yogurt.  6 oz plain yogurt. Vegetables  1 cup raw or  cup cooked is counted as 0 carbohydrates or a "free" food.  If you eat 3 or more servings at 1 meal, count them as 1 carbohydrate serving. Other Carbohydrates   oz chips or pretzels.   cup ice cream or frozen yogurt.   cup sherbet or sorbet.  2 inch square cake, no frosting.  1 tbs honey, sugar, jam, jelly, or syrup.  2 small cookies.  3 squares of graham crackers.  3 cups popcorn.  6 crackers.  1 cup broth-based soup.  Count 1 cup casserole or other mixed foods as 2 carbohydrate servings.  Foods with less than 20 calories in a serving may be counted as 0 carbohydrates or a "free" food. You may want to purchase a book or computer software that lists the carbohydrate gram counts of different foods. In addition, the nutrition facts panel on the labels of the foods you eat are a good source of this information. The label will tell you how big the serving size is and the  total number of carbohydrate grams you will be eating per serving. Divide this number by 15 to obtain the number of carbohydrate servings in a portion. Remember, 1 carbohydrate serving equals 15 grams of carbohydrate. SERVING SIZES Measuring foods and serving sizes helps you make sure you are getting the right amount of food. The list below tells how big or small some common serving sizes are.  1 oz.........4 stacked dice.  3 oz........Marland KitchenDeck of cards.  1 tsp.......Marland KitchenTip of little finger.  1 tbs......Marland KitchenMarland KitchenThumb.  2 tbs.......Marland KitchenGolf ball.   cup......Marland KitchenHalf of a fist.  1 cup.......Marland KitchenA fist. SAMPLE DIABETES MEAL PLAN Below is a sample meal plan that includes foods from the grain and starches, dairy, vegetable, fruit, and meat groups. A dietician  can individualize a meal plan to fit your calorie needs and tell you the number of servings needed from each food group. However, controlling the total amount of carbohydrates in your meal or snack is more important than making sure you include all of the food groups at every meal. You may interchange carbohydrate containing foods (dairy, starches, and fruits). The meal plan below is an example of a 2000 calorie diet using carbohydrate counting. This meal plan has 17 carbohydrate servings. Breakfast  1 cup oatmeal (2 carb servings).   cup light yogurt (1 carb serving).  1 cup blueberries (1 carb serving).   cup almonds. Snack  1 large apple (2 carb servings).  1 low-fat string cheese stick. Lunch  Chicken breast salad.  1 cup spinach.   cup chopped tomatoes.  2 oz chicken breast, sliced.  2 tbs low-fat [place] dressing.  12 whole-wheat crackers (2 carb servings).  12 to 15 grapes (1 carb serving).  1 cup low-fat milk (1 carb serving). Snack  1 cup carrots.   cup hummus (1 carb serving). Dinner  3 oz broiled salmon.  1 cup brown rice (3 carb servings). Snack  1  cups steamed broccoli (1 carb serving) drizzled with 1  tsp olive oil and lemon juice.  1 cup light pudding (2 carb servings). DIABETES MEAL PLANNING WORKSHEET Your dietician can use this worksheet to help you decide how many servings of foods and what types of foods are right for you.  BREAKFAST Food Group and Servings / Carb Servings Grain/Starches __________________________________ Dairy __________________________________________ Vegetable ______________________________________ Fruit ___________________________________________ Meat __________________________________________ Fat ____________________________________________ LUNCH Food Group and Servings / Carb Servings Grain/Starches ___________________________________ Dairy ___________________________________________ Fruit ____________________________________________ Meat ___________________________________________ Fat _____________________________________________ Spencer Woodward Food Group and Servings / Carb Servings Grain/Starches ___________________________________ Dairy ___________________________________________ Fruit ____________________________________________ Meat ___________________________________________ Fat _____________________________________________ SNACKS Food Group and Servings / Carb Servings Grain/Starches ___________________________________ Dairy ___________________________________________ Vegetable _______________________________________ Fruit ____________________________________________ Meat ___________________________________________ Fat _____________________________________________ DAILY TOTALS Starches _________________________ Vegetable ________________________ Fruit ____________________________ Dairy ____________________________ Meat ____________________________ Fat ______________________________ Document Released: 11/21/2004 Document Revised: 05/19/2011 Document Reviewed: 10/02/2008 [employer] Patient Information 2013 [place], [place]. Diabetes and Standards  of Medical Care  Diabetes is complicated. You may find that your diabetes team includes a dietitian, nurse, diabetes educator, eye doctor, and more. To help everyone know what is going on and to help you get the care you deserve, the following schedule of care was developed to help keep you on track. Below are the tests, exams, vaccines, medicines, education, and plans you will need. A1c test  Performed at least 2 times a year if you are meeting treatment goals.  Performed 4 times a year if therapy has changed or if you are not meeting therapy/glycemic goals. Aspirin medicine  Take daily as directed by your caregiver. Blood pressure test  Performed at every routine medical visit. The goal is less than 130/80 mm/Hg. Dental exam  Get a dental exam at least 2 times a year. Dilated eye exam (retinal exam)  Type 1 diabetes: Get an exam within 5 years of diagnosis and then yearly.  Type 2 diabetes: Get an exam at diagnosis and then yearly. All exams thereafter can be extended to every 2 to 3 years if one or more exams have been normal. Foot care exam  Visual foot exams are performed at every routine medical visit. The exams check for cuts, injuries, or other problems with the feet.  A comprehensive foot exam should be done yearly. This includes visual inspection as well as assessing  foot pulses and testing for loss of sensation. Kidney function test (urine microalbumin)  Performed once a year.  Type 1 diabetes: The first test is performed 5 years after diagnosis.  Type 2 diabetes: The first test is performed at the time of diagnosis.  A serum creatinine and estimated glomerular filtration rate (eGFR) test is done once a year to tell the level of chronic kidney disease (CKD), if present. Lipid profile (Cholesterol, HDL, LDL, Triglycerides)  Performed once a year for most people. If at low risk, may be assessed every 2 years.  The goal for LDL is less than 100 mg/dl. If at high risk,  the goal is less than 70 mg/dl.  The goal for HDL is higher than 40 mg/dl for men and higher than 50 mg/dl for women.  The goal for triglycerides is less than 150 mg/dl. Flu vaccine, pneumonia vaccine, and hepatitis B vaccine  The flu vaccine is recommended yearly.  The pneumonia vaccine is generally given once in a lifetime. However, there are some instances where another vaccine is recommended. Check with your caregiver.  The hepatitis B vaccine is also recommended for adults with diabetes. Diabetes self-management education  Recommended at diagnosis and ongoing as needed. Treatment plan  Reviewed at every medical visit. Document Released: 12/22/2008 Document Revised: 05/19/2011 Document Reviewed: 08/27/2010 [facility] Patient Information 2013 [place], [place].

## 2012-05-15 NOTE — Progress Notes (Signed)
Subjective:    Patient ID: Spencer Woodward, male    DOB: May 30, 1960, 52 y.o.   MRN: [id number] Chief Complaint  Patient presents with  . blood sugar check    urinary frequency, thirsty    HPI  For the past 2 wks he has had severe dry mouth and urinary freq.  He has been on glyburide in the past but was always able to go off of it and just have his DM controlled on metformin eventually. He has been taking his metformin but does not check cbgs at home. No health insurance so tries to minimize testing and visits.  Past Medical History  Diagnosis Date  . Diabetes mellitus without complication    Current Outpatient Prescriptions on File Prior to Visit  Medication Sig Dispense Refill  . Multiple Vitamin (MULTIVITAMIN) tablet Take 1 tablet by mouth daily.      . Testosterone (ANDROGEL) 20.25 MG/1.25GM (1.62%) GEL Place 2 application onto the skin daily. Please change application to pump actuations.  2.5 g  5   No current facility-administered medications on file prior to visit.   No Known Allergies   Review of Systems  Constitutional: Positive for appetite change and fatigue.  Respiratory: Negative for shortness of breath.   Cardiovascular: Negative for chest pain and leg swelling.  Endocrine: Positive for polydipsia, polyphagia and polyuria. Negative for cold intolerance and heat intolerance.  Genitourinary: Positive for frequency. Negative for dysuria and difficulty urinating.  Skin: Negative for rash.  Neurological: Negative for weakness and numbness.      BP 120/80  Pulse 111  Temp(Src) 97.5 F (36.4 C) (Oral)  Resp 18  Ht 6\' 2"  (1.88 m)  Wt 310 lb 9.6 oz (140.887 kg)  BMI 39.86 kg/m2  SpO2 97% Objective:   Physical Exam  Constitutional: He is oriented to person, place, and time. He appears well-developed and well-nourished. No distress.  HENT:  Head: Normocephalic and atraumatic.  Eyes: Conjunctivae are normal. Pupils are equal, round, and reactive to light. No scleral  icterus.  Neck: Normal range of motion. Neck supple. No thyromegaly present.  Cardiovascular: Normal rate, regular rhythm, normal heart sounds and intact distal pulses.   Pulmonary/Chest: Effort normal and breath sounds normal. No respiratory distress.  Musculoskeletal: He exhibits no edema.  Lymphadenopathy:    He has no cervical adenopathy.  Neurological: He is alert and oriented to person, place, and time.  Skin: Skin is warm and dry. He is not diaphoretic.  Psychiatric: He has a normal mood and affect. His behavior is normal.      Results for orders placed in visit on 05/15/12  GLUCOSE, POCT (MANUAL RESULT ENTRY)      Result Value Range   POC Glucose HHH >444  70 - 99 mg/dl    Assessment & Plan:  Type II or unspecified type diabetes mellitus without mention of complication, uncontrolled - Plan: POCT glucose (manual entry) - continue max dose metformin and start glyburide.  Monitor cbgs due to possibility for hypoglycemia (though unlikely). Work on diet/exercise. If cont to have high cbgs, f/u immed.  HTN (hypertension)  Other and unspecified hyperlipidemia  Encounter for long-term (current) use of other medications  Meds ordered this encounter  Medications  . DISCONTD: glyBURIDE (DIABETA) 5 MG tablet    Sig: Take 5 mg by mouth 2 (two) times daily with a meal.  . penicillin v potassium (VEETID) 500 MG tablet    Sig: Take 500 mg by mouth 4 (four) times daily.  Marland Kitchen  metFORMIN (GLUCOPHAGE) 1000 MG tablet    Sig: Take 1 tablet (1,000 mg total) by mouth 2 (two) times daily with a meal.    Dispense:  180 tablet    Refill:  1  . glyBURIDE (DIABETA) 5 MG tablet    Sig: Take 1 tablet (5 mg total) by mouth 2 (two) times daily with a meal.    Dispense:  180 tablet    Refill:  1  . lisinopril-hydrochlorothiazide (PRINZIDE,ZESTORETIC) 20-12.5 MG per tablet    Sig: Take 2 tablets by mouth daily.    Dispense:  180 tablet    Refill:  0  . lovastatin (MEVACOR) 20 MG tablet    Sig: Take 1  tablet (20 mg total) by mouth at bedtime.    Dispense:  90 tablet    Refill:  0

## 2014-03-21 ENCOUNTER — Ambulatory Visit: Payer: Self-pay

## 2014-07-13 ENCOUNTER — Ambulatory Visit
Admission: RE | Admit: 2014-07-13 | Discharge: 2014-07-13 | Disposition: A | Discharge status: Home / Self Care | Payer: No Typology Code available for payment source | Source: Ambulatory Visit | Attending: *Deleted | Admitting: *Deleted

## 2016-04-22 ENCOUNTER — Other Ambulatory Visit (HOSPITAL_BASED_OUTPATIENT_CLINIC_OR_DEPARTMENT_OTHER): Payer: Self-pay

## 2016-04-22 DIAGNOSIS — R5383 Other fatigue: Secondary | ICD-10-CM

## 2016-04-22 DIAGNOSIS — R0683 Snoring: Secondary | ICD-10-CM

## 2016-05-13 ENCOUNTER — Ambulatory Visit (HOSPITAL_BASED_OUTPATIENT_CLINIC_OR_DEPARTMENT_OTHER): Payer: No Typology Code available for payment source | Attending: Internal Medicine

## 2017-01-06 ENCOUNTER — Ambulatory Visit (INDEPENDENT_AMBULATORY_CARE_PROVIDER_SITE_OTHER): Payer: BLUE CROSS/BLUE SHIELD | Admitting: Urology

## 2017-01-06 ENCOUNTER — Encounter: Payer: Self-pay | Admitting: Urology

## 2017-01-06 VITALS — BP 144/91 | HR 78 | Ht 74.0 in | Wt 339.3 lb

## 2017-01-06 DIAGNOSIS — N521 Erectile dysfunction due to diseases classified elsewhere: Secondary | ICD-10-CM

## 2017-01-06 MED ORDER — AVANAFIL 200 MG PO TABS: 200.0000 | ORAL_TABLET | Freq: Every day | ORAL | 6 refills | Status: DC | PRN

## 2017-01-06 NOTE — Progress Notes (Signed)
01/06/2017 9:45 AM   Spencer Woodward 03/15/1960 [id number]  Referring provider: Preston Fleetingevelo, Adrian Mancheno, MD [address] [place], [place] [id number]  Chief Complaint  Patient presents with  . Erectile Dysfunction    HPI: Consult for ED. He's noticed trouble keeping an erection 5 years ago. Now trouble getting and maintaining an erection. He tried Viagra 100 mg and it worked [employer]. He tried Cialis and had severe HA. He has two kids. Associated with DM, PE/DVT on coumadin, HTN. He has a good libido. June 2018 bun 13, cr 1.02.   Modifying factors: There are no other modifying factors  Associated signs and symptoms: There are no other associated signs and symptoms Aggravating and relieving factors: There are no other aggravating or relieving factors Severity: Moderate Duration: Persistent  PMH: Past Medical History:  Diagnosis Date  . Diabetes mellitus without complication [facility](HCC)     Surgical History: Past Surgical History:  Procedure Laterality Date  . TONSILECTOMY, ADENOIDECTOMY, BILATERAL MYRINGOTOMY AND TUBES      Home Medications:  Allergies as of 01/06/2017   No Known Allergies     Medication List       Accurate as of 01/06/17  9:45 AM. Always use your most recent med list.          glyBURIDE 5 MG tablet Commonly known as:  DIABETA Take 1 tablet (5 mg total) by mouth 2 (two) times daily with a meal.   lisinopril-hydrochlorothiazide 20-12.5 MG tablet Commonly known as:  PRINZIDE,ZESTORETIC Take 2 tablets by mouth daily.   lovastatin 20 MG tablet Commonly known as:  MEVACOR Take 1 tablet (20 mg total) by mouth at bedtime.   metFORMIN 1000 MG tablet Commonly known as:  GLUCOPHAGE Take 1 tablet (1,000 mg total) by mouth 2 (two) times daily with a meal.   multivitamin tablet Take 1 tablet by mouth daily.   Testosterone 20.25 MG/1.25GM (1.62%) Gel Commonly known as:  ANDROGEL Place 2 application onto the skin daily. Please change application to pump  actuations.       Allergies: No Known Allergies  Family History: Family History  Problem Relation Age of Onset  . Diabetes Mother   . Heart disease Father   . Alcohol abuse Father   . Prostate cancer Neg Hx   . Bladder Cancer Neg Hx   . Kidney cancer Neg Hx     Social History:  reports that he has never smoked. He has never used smokeless tobacco. He reports that he does not drink alcohol or use drugs.  ROS: UROLOGY Frequent Urination?: No Hard to postpone urination?: No Burning/pain with urination?: No Get up at night to urinate?: No Leakage of urine?: No Urine stream starts and stops?: No Trouble starting stream?: No Do you have to strain to urinate?: No Blood in urine?: No Urinary tract infection?: No Sexually transmitted disease?: No Injury to kidneys or bladder?: No Painful intercourse?: No Weak stream?: No Erection problems?: Yes Penile pain?: No  Gastrointestinal Nausea?: No Vomiting?: No Indigestion/heartburn?: No Diarrhea?: No Constipation?: No  Constitutional Fever: No Night sweats?: No Weight loss?: No Fatigue?: No  Skin Skin rash/lesions?: No Itching?: No  Eyes Blurred vision?: No Double vision?: No  Ears/Nose/Throat Sore throat?: No Sinus problems?: No  Hematologic/Lymphatic Swollen glands?: No Easy bruising?: No  Cardiovascular Leg swelling?: No Chest pain?: No  Respiratory Cough?: No Shortness of breath?: No  Endocrine Excessive thirst?: No  Musculoskeletal Back pain?: Yes Joint pain?: No  Neurological Headaches?: No Dizziness?: No  Psychologic Depression?: No Anxiety?: No  Physical Exam: BP (!) 144/91 (BP Location: Right Arm, Patient Position: Sitting, Cuff Size: Large)   Pulse 78   Ht 6\' 2"  (1.88 m)   Wt (!) 153.9 kg (339 lb 4.8 oz)   BMI 43.56 kg/m   Constitutional:  Alert and oriented, No acute distress. HEENT: [place] AT, moist mucus membranes.  Trachea midline, no masses. Cardiovascular: No clubbing,  cyanosis, or edema. Respiratory: Normal respiratory effort, no increased work of breathing. GI: Abdomen is soft, nontender, nondistended, no abdominal masses GU: No CVA tenderness. Penis: circumcised, no lesion Scrotum: normal. Testicles good size and palpably normal DRE: prostate 30 g, smooth, no hard area or nodule  Skin: No rashes, bruises or suspicious lesions. Neurologic: Grossly intact, no focal deficits, moving all 4 extremities. Psychiatric: Normal mood and affect.  Laboratory Data: Lab Results  Component Value Date   WBC 9.0 12/16/2011   HGB 15.3 12/16/2011   HCT 43.9 12/16/2011   MCV 79 (L) 12/16/2011   PLT 214 12/16/2011    Lab Results  Component Value Date   CREATININE 1.03 04/02/2012    No results found for: PSA1  Lab Results  Component Value Date   TESTOSTERONE 219.52 (L) 04/02/2012    Lab Results  Component Value Date   HGBA1C 8.4%+ 04/02/2012    Urinalysis Lab Results  Component Value Date   APPEARANCEUR Clear 12/16/2011   LEUKOCYTESUR Negative 12/16/2011   PROTEINUR Negative 12/16/2011   GLUCOSEU >=500 12/16/2011   RBCU 1 /HPF 12/16/2011   BILIRUBINUR Negative 12/16/2011   NITRITE Negative 12/16/2011    Lab Results  Component Value Date   BACTERIA NONE SEEN 12/16/2011    No results found for this or any previous visit. No results found for this or any previous visit. No results found for this or any previous visit. No results found for this or any previous visit. No results found for this or any previous visit. No results found for this or any previous visit. No results found for this or any previous visit. No results found for this or any previous visit.  Assessment & Plan:    ED - discussed nature r/b pde5i, injections, Muse, and VED. He'll try Stendra 200 mg and can take 100mg . F/u 6 months.   There are no diagnoses linked to this encounter.  No Follow-up on file.  Jerilee Field, MD  [facility] [address] [place], [place] [address] [phone]

## 2017-01-07 LAB — PSA: Prostate Specific Ag, Serum: 1.3 ng/mL (ref 0.0–4.0)

## 2017-01-09 ENCOUNTER — Telehealth: Payer: Self-pay | Admitting: Family Medicine

## 2017-01-09 NOTE — Telephone Encounter (Signed)
Patient called stating the Jerral RalphStendra costs over 700.00 he would like to know if there is something else he could try. Please advise.

## 2017-01-09 NOTE — Telephone Encounter (Signed)
-----   Message from Jerilee FieldMatthew Eskridge, MD sent at 01/07/2017  6:25 PM EDT ----- Notify patient -- PSA normal. Give result.   ----- Message ----- From: Honor LohGarrison, Janeka Libman M, CMA Sent: 01/07/2017   9:11 AM To: Jerilee FieldMatthew Eskridge, MD    ----- Message ----- From: Interface, [facility] Lab Results In Sent: 01/07/2017   5:42 AM To: Jennette KettleBua Clinical

## 2017-01-09 NOTE — Telephone Encounter (Signed)
Patient notified

## 2017-01-09 NOTE — Telephone Encounter (Signed)
He could check goodrx.com and research prices on Levitra and Cialis. Or we can send him in sildenafil (it's the same medicine as viagra). If he wants to try sildenafil send in : sildenafil 20 mg, take 1-5 tabs as needed; #30, 6 refills.

## 2017-01-13 NOTE — Telephone Encounter (Signed)
LMOM for patient to return call.

## 2017-01-16 NOTE — Telephone Encounter (Signed)
Not able to get in touch with pt. Will send a letter.  

## 2017-07-06 ENCOUNTER — Ambulatory Visit: Payer: BLUE CROSS/BLUE SHIELD | Admitting: Urology

## 2017-07-06 ENCOUNTER — Encounter: Payer: Self-pay | Admitting: Urology

## 2017-08-10 ENCOUNTER — Ambulatory Visit: Payer: BLUE CROSS/BLUE SHIELD | Admitting: Urology

## 2017-08-10 ENCOUNTER — Encounter: Payer: Self-pay | Admitting: Urology

## 2018-12-07 ENCOUNTER — Ambulatory Visit
Admission: RE | Admit: 2018-12-07 | Discharge: 2018-12-07 | Disposition: A | Discharge status: Home / Self Care | Payer: BLUE CROSS/BLUE SHIELD | Source: Ambulatory Visit | Attending: Family Medicine | Admitting: Family Medicine

## 2018-12-07 ENCOUNTER — Other Ambulatory Visit: Payer: Self-pay | Admitting: Family Medicine

## 2018-12-07 DIAGNOSIS — I2699 Other pulmonary embolism without acute cor pulmonale: Secondary | ICD-10-CM | POA: Insufficient documentation

## 2019-10-22 ENCOUNTER — Encounter: Payer: Self-pay | Admitting: Internal Medicine

## 2019-10-22 ENCOUNTER — Emergency Department: Payer: 59

## 2019-10-22 ENCOUNTER — Inpatient Hospital Stay
Admission: EM | Admit: 2019-10-22 | Discharge: 2019-11-09 | DRG: 207 | Disposition: E | Payer: 59 | Attending: Internal Medicine | Admitting: Internal Medicine

## 2019-10-22 DIAGNOSIS — Z6841 Body Mass Index (BMI) 40.0 and over, adult: Secondary | ICD-10-CM

## 2019-10-22 DIAGNOSIS — E119 Type 2 diabetes mellitus without complications: Secondary | ICD-10-CM

## 2019-10-22 DIAGNOSIS — Z86711 Personal history of pulmonary embolism: Secondary | ICD-10-CM

## 2019-10-22 DIAGNOSIS — M109 Gout, unspecified: Secondary | ICD-10-CM | POA: Diagnosis present

## 2019-10-22 DIAGNOSIS — E669 Obesity, unspecified: Secondary | ICD-10-CM | POA: Diagnosis present

## 2019-10-22 DIAGNOSIS — R339 Retention of urine, unspecified: Secondary | ICD-10-CM | POA: Diagnosis not present

## 2019-10-22 DIAGNOSIS — G92 Toxic encephalopathy: Secondary | ICD-10-CM | POA: Diagnosis not present

## 2019-10-22 DIAGNOSIS — R34 Anuria and oliguria: Secondary | ICD-10-CM | POA: Diagnosis not present

## 2019-10-22 DIAGNOSIS — J1282 Pneumonia due to coronavirus disease 2019: Secondary | ICD-10-CM

## 2019-10-22 DIAGNOSIS — J80 Acute respiratory distress syndrome: Secondary | ICD-10-CM | POA: Diagnosis not present

## 2019-10-22 DIAGNOSIS — R570 Cardiogenic shock: Secondary | ICD-10-CM | POA: Diagnosis not present

## 2019-10-22 DIAGNOSIS — N170 Acute kidney failure with tubular necrosis: Secondary | ICD-10-CM | POA: Diagnosis present

## 2019-10-22 DIAGNOSIS — T380X5A Adverse effect of glucocorticoids and synthetic analogues, initial encounter: Secondary | ICD-10-CM | POA: Diagnosis not present

## 2019-10-22 DIAGNOSIS — E1165 Type 2 diabetes mellitus with hyperglycemia: Secondary | ICD-10-CM | POA: Diagnosis not present

## 2019-10-22 DIAGNOSIS — J9601 Acute respiratory failure with hypoxia: Secondary | ICD-10-CM

## 2019-10-22 DIAGNOSIS — E785 Hyperlipidemia, unspecified: Secondary | ICD-10-CM | POA: Diagnosis not present

## 2019-10-22 DIAGNOSIS — R791 Abnormal coagulation profile: Secondary | ICD-10-CM | POA: Diagnosis present

## 2019-10-22 DIAGNOSIS — K7200 Acute and subacute hepatic failure without coma: Secondary | ICD-10-CM | POA: Diagnosis not present

## 2019-10-22 DIAGNOSIS — Z833 Family history of diabetes mellitus: Secondary | ICD-10-CM

## 2019-10-22 DIAGNOSIS — I6381 Other cerebral infarction due to occlusion or stenosis of small artery: Secondary | ICD-10-CM | POA: Diagnosis not present

## 2019-10-22 DIAGNOSIS — I110 Hypertensive heart disease with heart failure: Secondary | ICD-10-CM | POA: Diagnosis present

## 2019-10-22 DIAGNOSIS — I10 Essential (primary) hypertension: Secondary | ICD-10-CM | POA: Diagnosis present

## 2019-10-22 DIAGNOSIS — R0689 Other abnormalities of breathing: Secondary | ICD-10-CM | POA: Diagnosis not present

## 2019-10-22 DIAGNOSIS — Z7901 Long term (current) use of anticoagulants: Secondary | ICD-10-CM

## 2019-10-22 DIAGNOSIS — K5900 Constipation, unspecified: Secondary | ICD-10-CM | POA: Diagnosis present

## 2019-10-22 DIAGNOSIS — Z515 Encounter for palliative care: Secondary | ICD-10-CM

## 2019-10-22 DIAGNOSIS — R609 Edema, unspecified: Secondary | ICD-10-CM

## 2019-10-22 DIAGNOSIS — Z66 Do not resuscitate: Secondary | ICD-10-CM | POA: Diagnosis not present

## 2019-10-22 DIAGNOSIS — U071 COVID-19: Principal | ICD-10-CM | POA: Diagnosis present

## 2019-10-22 DIAGNOSIS — Z79899 Other long term (current) drug therapy: Secondary | ICD-10-CM

## 2019-10-22 DIAGNOSIS — J939 Pneumothorax, unspecified: Secondary | ICD-10-CM | POA: Diagnosis not present

## 2019-10-22 DIAGNOSIS — E66813 Obesity, class 3: Secondary | ICD-10-CM

## 2019-10-22 DIAGNOSIS — Z792 Long term (current) use of antibiotics: Secondary | ICD-10-CM

## 2019-10-22 DIAGNOSIS — J9600 Acute respiratory failure, unspecified whether with hypoxia or hypercapnia: Secondary | ICD-10-CM | POA: Diagnosis not present

## 2019-10-22 DIAGNOSIS — Z0189 Encounter for other specified special examinations: Secondary | ICD-10-CM

## 2019-10-22 DIAGNOSIS — E041 Nontoxic single thyroid nodule: Secondary | ICD-10-CM | POA: Diagnosis present

## 2019-10-22 DIAGNOSIS — Z8249 Family history of ischemic heart disease and other diseases of the circulatory system: Secondary | ICD-10-CM

## 2019-10-22 DIAGNOSIS — Z9911 Dependence on respirator [ventilator] status: Secondary | ICD-10-CM

## 2019-10-22 DIAGNOSIS — E875 Hyperkalemia: Secondary | ICD-10-CM | POA: Diagnosis present

## 2019-10-22 DIAGNOSIS — Z7189 Other specified counseling: Secondary | ICD-10-CM | POA: Diagnosis not present

## 2019-10-22 DIAGNOSIS — Z86718 Personal history of other venous thrombosis and embolism: Secondary | ICD-10-CM

## 2019-10-22 DIAGNOSIS — I5041 Acute combined systolic (congestive) and diastolic (congestive) heart failure: Secondary | ICD-10-CM | POA: Diagnosis present

## 2019-10-22 DIAGNOSIS — Z794 Long term (current) use of insulin: Secondary | ICD-10-CM

## 2019-10-22 LAB — COMPREHENSIVE METABOLIC PANEL
ALT: 55 U/L — ABNORMAL HIGH (ref 0–44)
AST: 82 U/L — ABNORMAL HIGH (ref 15–41)
Albumin: 3.4 g/dL — ABNORMAL LOW (ref 3.5–5.0)
Alkaline Phosphatase: 59 U/L (ref 38–126)
Anion gap: 13 (ref 5–15)
BUN: 26 mg/dL — ABNORMAL HIGH (ref 6–20)
CO2: 23 mmol/L (ref 22–32)
Calcium: 7.8 mg/dL — ABNORMAL LOW (ref 8.9–10.3)
Chloride: 98 mmol/L (ref 98–111)
Creatinine, Ser: 1.53 mg/dL — ABNORMAL HIGH (ref 0.61–1.24)
GFR calc Af Amer: 58 mL/min — ABNORMAL LOW (ref >60)
GFR calc non Af Amer: 50 mL/min — ABNORMAL LOW (ref >60)
Glucose, Bld: 408 mg/dL — ABNORMAL HIGH (ref 70–99)
Potassium: 4.3 mmol/L (ref 3.5–5.1)
Sodium: 134 mmol/L — ABNORMAL LOW (ref 135–145)
Total Bilirubin: 0.9 mg/dL (ref 0.3–1.2)
Total Protein: 7.9 g/dL (ref 6.5–8.1)

## 2019-10-22 LAB — FIBRIN DERIVATIVES D-DIMER (ARMC ONLY): Fibrin derivatives D-dimer (ARMC): 790.4 ng/mL (FEU) — ABNORMAL HIGH (ref 0.00–499.00)

## 2019-10-22 LAB — FERRITIN: Ferritin: 1481 ng/mL — ABNORMAL HIGH (ref 24–336)

## 2019-10-22 LAB — CBC WITH DIFFERENTIAL/PLATELET
Abs Immature Granulocytes: 0.08 10*3/uL — ABNORMAL HIGH (ref 0.00–0.07)
Basophils Absolute: 0 10*3/uL (ref 0.0–0.1)
Basophils Relative: 0 %
Eosinophils Absolute: 0 10*3/uL (ref 0.0–0.5)
Eosinophils Relative: 0 %
HCT: 39.9 % (ref 39.0–52.0)
Hemoglobin: 14.4 g/dL (ref 13.0–17.0)
Immature Granulocytes: 1 %
Lymphocytes Relative: 5 %
Lymphs Abs: 0.5 10*3/uL — ABNORMAL LOW (ref 0.7–4.0)
MCH: 27 pg (ref 26.0–34.0)
MCHC: 36.1 g/dL — ABNORMAL HIGH (ref 30.0–36.0)
MCV: 74.7 fL — ABNORMAL LOW (ref 80.0–100.0)
Monocytes Absolute: 0.3 10*3/uL (ref 0.1–1.0)
Monocytes Relative: 2 %
Neutro Abs: 9.9 10*3/uL — ABNORMAL HIGH (ref 1.7–7.7)
Neutrophils Relative %: 92 %
Platelets: 244 10*3/uL (ref 150–400)
RBC: 5.34 MIL/uL (ref 4.22–5.81)
RDW: 14.6 % (ref 11.5–15.5)
WBC: 10.8 10*3/uL — ABNORMAL HIGH (ref 4.0–10.5)
nRBC: 0 % (ref 0.0–0.2)

## 2019-10-22 LAB — GLUCOSE, CAPILLARY: Glucose-Capillary: 370 mg/dL — ABNORMAL HIGH (ref 70–99)

## 2019-10-22 LAB — LACTATE DEHYDROGENASE: LDH: 548 U/L — ABNORMAL HIGH (ref 98–192)

## 2019-10-22 LAB — SARS CORONAVIRUS 2 BY RT PCR (HOSPITAL ORDER, PERFORMED IN ~~LOC~~ HOSPITAL LAB): SARS Coronavirus 2: POSITIVE — AB

## 2019-10-22 LAB — POC SARS CORONAVIRUS 2 AG: SARS Coronavirus 2 Ag: POSITIVE — AB

## 2019-10-22 LAB — PROCALCITONIN: Procalcitonin: 1.51 ng/mL

## 2019-10-22 LAB — LACTIC ACID, PLASMA
Lactic Acid, Venous: 1.8 mmol/L (ref 0.5–1.9)
Lactic Acid, Venous: 1.4 mmol/L (ref 0.5–1.9)

## 2019-10-22 LAB — FIBRINOGEN: Fibrinogen: 737 mg/dL — ABNORMAL HIGH (ref 210–475)

## 2019-10-22 LAB — BRAIN NATRIURETIC PEPTIDE: B Natriuretic Peptide: 50.1 pg/mL (ref 0.0–100.0)

## 2019-10-22 LAB — TRIGLYCERIDES: Triglycerides: 144 mg/dL (ref <150)

## 2019-10-22 MED ORDER — INSULIN ASPART 100 UNIT/ML ~~LOC~~ SOLN
0.0000 [IU] | Freq: Every day | SUBCUTANEOUS | Status: DC
  Administered 2019-10-22: 5 [IU] via SUBCUTANEOUS
  Filled 2019-10-22: qty 1

## 2019-10-22 MED ORDER — IOHEXOL 350 MG/ML SOLN
75.0000 mL | Freq: Once | INTRAVENOUS | Status: AC | PRN
  Administered 2019-10-22: 75 mL via INTRAVENOUS

## 2019-10-22 MED ORDER — LINAGLIPTIN 5 MG PO TABS
5.0000 mg | ORAL_TABLET | Freq: Every day | ORAL | Status: DC
  Administered 2019-10-22 – 2019-10-28 (×2): 5 mg via ORAL
  Filled 2019-10-22 (×10): qty 1

## 2019-10-22 MED ORDER — SODIUM CHLORIDE 0.9 % IV SOLN
200.0000 mg | Freq: Once | INTRAVENOUS | Status: AC
  Administered 2019-10-22: 200 mg via INTRAVENOUS
  Filled 2019-10-22: qty 200

## 2019-10-22 MED ORDER — ALBUTEROL SULFATE HFA 108 (90 BASE) MCG/ACT IN AERS
2.0000 | INHALATION_SPRAY | Freq: Once | RESPIRATORY_TRACT | Status: AC
  Administered 2019-10-22: 2 via RESPIRATORY_TRACT
  Filled 2019-10-22 (×2): qty 6.7

## 2019-10-22 MED ORDER — SODIUM CHLORIDE 0.9 % IV SOLN
2.0000 g | INTRAVENOUS | Status: DC
  Administered 2019-10-22 – 2019-10-30 (×9): 2 g via INTRAVENOUS
  Filled 2019-10-22: qty 2
  Filled 2019-10-22 (×2): qty 20
  Filled 2019-10-22: qty 2
  Filled 2019-10-22: qty 20
  Filled 2019-10-22 (×4): qty 2
  Filled 2019-10-22: qty 20

## 2019-10-22 MED ORDER — SODIUM CHLORIDE 0.9 % IV SOLN
1000.0000 mL | INTRAVENOUS | Status: DC
  Administered 2019-10-22: 1000 mL via INTRAVENOUS

## 2019-10-22 MED ORDER — LACTATED RINGERS IV SOLN: INTRAVENOUS | Status: DC

## 2019-10-22 MED ORDER — DEXAMETHASONE SODIUM PHOSPHATE 10 MG/ML IJ SOLN
8.0000 mg | Freq: Once | INTRAMUSCULAR | Status: AC
  Administered 2019-10-22: 8 mg via INTRAVENOUS
  Filled 2019-10-22: qty 1

## 2019-10-22 MED ORDER — INSULIN ASPART 100 UNIT/ML ~~LOC~~ SOLN
6.0000 [IU] | Freq: Once | SUBCUTANEOUS | Status: AC
  Administered 2019-10-22: 6 [IU] via INTRAVENOUS
  Filled 2019-10-22: qty 1

## 2019-10-22 MED ORDER — SODIUM CHLORIDE 0.9 % IV SOLN: Freq: Once | INTRAVENOUS | Status: AC

## 2019-10-22 MED ORDER — INSULIN ASPART 100 UNIT/ML ~~LOC~~ SOLN: 0.0000 [IU] | Freq: Three times a day (TID) | SUBCUTANEOUS | Status: DC

## 2019-10-22 MED ORDER — SODIUM CHLORIDE 0.9 % IV SOLN
100.0000 mg | Freq: Every day | INTRAVENOUS | Status: AC
  Administered 2019-10-23 – 2019-10-26 (×4): 100 mg via INTRAVENOUS
  Filled 2019-10-22: qty 20
  Filled 2019-10-22: qty 100
  Filled 2019-10-22: qty 20
  Filled 2019-10-22: qty 100

## 2019-10-22 MED ORDER — SODIUM CHLORIDE 0.9 % IV SOLN
500.0000 mg | INTRAVENOUS | Status: DC
  Administered 2019-10-22 – 2019-10-30 (×9): 500 mg via INTRAVENOUS
  Filled 2019-10-22 (×10): qty 500

## 2019-10-22 NOTE — ED Notes (Signed)
Elevated the head of the bed for patient.AS

## 2019-10-22 NOTE — Progress Notes (Signed)
CODE SEPSIS - PHARMACY COMMUNICATION  **Broad Spectrum Antibiotics should be administered within 1 hour of Sepsis diagnosis**  Time Code Sepsis Called/Page Received: 1730  Antibiotics Ordered: Ceftriaxone, Azithromycin  Time of 1st antibiotic administration: 1741  Additional action taken by pharmacy: none  If necessary, Name of Provider/Nurse Contacted: n/a    Bettey Costa ,PharmD Clinical Pharmacist  11/07/2019  5:50 PM

## 2019-10-22 NOTE — ED Notes (Signed)
Pt to go to ct on 15lpm non rebreather, not able to take high flow to ct per RT.

## 2019-10-22 NOTE — ED Notes (Signed)
Pt to ct scan.

## 2019-10-22 NOTE — Progress Notes (Signed)
Remdesivir - Pharmacy Brief Note   O:  CXR: patchy bilateral airspace opacities SpO2: 78% on RA   A/P:  Remdesivir 200 mg IVPB once followed by 100 mg IVPB daily x 4 days.   Clovia Cuff, PharmD, BCPS 10/31/2019 6:47 PM

## 2019-10-22 NOTE — ED Provider Notes (Signed)
[facility] Emergency Department Provider Note    First MD Initiated Contact with Patient 10/30/2019 1647     (approximate)  I have reviewed the triage vital signs and the nursing notes.   HISTORY  Chief Complaint Shortness of Breath    HPI Spencer Woodward is a 59 y.o. male the below listed past medical history presents to the ER for evaluation of weakness and shortness of breath.  Patient did have recent contact with some of the tested positive for Covid this past week.  States that over the past 2 days has been feeling worsening fatigue and shortness of breath.  Does have some discomfort with taking deep inspiration.  Has been having some chills but no measured temperature.  Denies any history of COPD CHF or cardiac issues.    Past Medical History:  Diagnosis Date  . Diabetes mellitus without complication (HCC)    Family History  Problem Relation Age of Onset  . Diabetes Mother   . Heart disease Father   . Alcohol abuse Father   . Prostate cancer Neg Hx   . Bladder Cancer Neg Hx   . Kidney cancer Neg Hx    Past Surgical History:  Procedure Laterality Date  . TONSILECTOMY, ADENOIDECTOMY, BILATERAL MYRINGOTOMY AND TUBES     Patient Active Problem List   Diagnosis Date Noted  . Other and unspecified hyperlipidemia 05/15/2012  . Encounter for long-term (current) use of other medications 05/15/2012  . Obesity 06/28/2011  . HTN (hypertension) 06/25/2011  . Type II or unspecified type diabetes mellitus without mention of complication, uncontrolled 06/25/2011  . Gout 06/25/2011      Prior to Admission medications   Medication Sig Start Date End Date Taking? Authorizing Provider  amLODipine (NORVASC) 10 MG tablet Take 10 mg by mouth daily.   Yes [provider]  atorvastatin (LIPITOR) 80 MG tablet Take 80 mg by mouth daily.   Yes [provider]  azithromycin (ZITHROMAX) 250 MG tablet Take by mouth. 10/21/19  Yes [provider]  empagliflozin (JARDIANCE) 25 MG TABS tablet Take 25 mg by mouth daily.   Yes [provider]  glipiZIDE (GLUCOTROL XL) 10 MG 24 hr tablet Take 10 mg by mouth daily with breakfast.   Yes [provider]  Insulin Glargine-Lixisenatide (SOLIQUA) 100-33 UNT-MCG/ML SOPN Inject 60 Units into the skin daily.    Yes [provider]  lisinopril (PRINIVIL,ZESTRIL) 40 MG tablet Take 40 mg by mouth daily.   Yes [provider]  metFORMIN (GLUCOPHAGE) 1000 MG tablet Take 1 tablet (1,000 mg total) by mouth 2 (two) times daily with a meal. 05/15/12  Yes Sherren Mocha, MD  Multiple Vitamin (MULTIVITAMIN) tablet Take 1 tablet by mouth daily.   Yes [provider]  sildenafil (VIAGRA) 100 MG tablet Take 100 mg by mouth daily as needed for erectile dysfunction.   Yes [provider]  warfarin (COUMADIN) 1 MG tablet Take 1 mg by mouth daily.   Yes [provider]  warfarin (COUMADIN) 10 MG tablet Take 10 mg by mouth daily.   Yes [provider]  colchicine 0.6 MG tablet Take 0.6 mg by mouth daily.    [provider]  PROAIR HFA 108 [phone] Base) MCG/ACT inhaler Inhale 2 puffs into the lungs every 4 (four) hours as needed. 10/21/19   [provider]    Allergies Patient has no known allergies.    Social History Social History   Tobacco Use  .  Smoking status: Never Smoker  . Smokeless tobacco: Never Used  Substance Use Topics  . Alcohol use: No    Comment: social  . Drug use: No    Review of Systems Patient denies headaches, rhinorrhea, blurry vision, numbness, shortness of breath, chest pain, edema, cough, abdominal pain, nausea, vomiting, diarrhea, dysuria, fevers, rashes or hallucinations unless otherwise stated above in HPI. ____________________________________________   PHYSICAL EXAM:  VITAL SIGNS: Vitals:   2019/11/17 1800 11-17-2019 1830  BP: 97/66 104/68  Pulse: (!) 109 (!) 109  Resp: 14 17   Temp:    SpO2: (!) 83% 99%    Constitutional: Alert and oriented in severe respiratory distress but protecting his airway Eyes: Conjunctivae are normal.  Head: Atraumatic. Nose: No congestion/rhinnorhea. Mouth/Throat: Mucous membranes are moist.   Neck: No stridor. Painless ROM.  Cardiovascular: Normal rate, regular rhythm. Grossly normal heart sounds.  Good peripheral circulation. Respiratory: Tachypnea with use of accessory muscles on nonrebreather mask.  Mildly tachypneic.  Does have diffuse inspiratory crackles. Gastrointestinal: Soft and nontender. No distention. No abdominal bruits. No CVA tenderness. Genitourinary:  Musculoskeletal: No lower extremity tenderness, trace BLE edema.  No joint effusions. Neurologic:  Normal speech and language. No gross focal neurologic deficits are appreciated. No facial droop Skin:  Skin is warm, dry and intact. No rash noted. Psychiatric: Mood and affect are normal. Speech and behavior are normal.  ____________________________________________   LABS (all labs ordered are listed, but only abnormal results are displayed)  Results for orders placed or performed during the hospital encounter of 2019-11-17 (from the past 24 hour(s))  CBC WITH DIFFERENTIAL     Status: Abnormal   Collection Time: 11/17/19  4:55 PM  Result Value Ref Range   WBC 10.8 (H) 4.0 - 10.5 K/uL   RBC 5.34 4.22 - 5.81 MIL/uL   Hemoglobin 14.4 13.0 - 17.0 g/dL   HCT 01.7 39 - 52 %   MCV 74.7 (L) 80.0 - 100.0 fL   MCH 27.0 26.0 - 34.0 pg   MCHC 36.1 (H) 30.0 - 36.0 g/dL   RDW 49.4 49.6 - 75.9 %   Platelets 244 150 - 400 K/uL   nRBC 0.0 0.0 - 0.2 %   Neutrophils Relative % 92 %   Neutro Abs 9.9 (H) 1.7 - 7.7 K/uL   Lymphocytes Relative 5 %   Lymphs Abs 0.5 (L) 0.7 - 4.0 K/uL   Monocytes Relative 2 %   Monocytes Absolute 0.3 0 - 1 K/uL   Eosinophils Relative 0 %   Eosinophils Absolute 0.0 0 - 0 K/uL   Basophils Relative 0 %   Basophils Absolute 0.0 0 - 0 K/uL    Immature Granulocytes 1 %   Abs Immature Granulocytes 0.08 (H) 0.00 - 0.07 K/uL  Comprehensive metabolic panel     Status: Abnormal   Collection Time: 2019/11/17  4:55 PM  Result Value Ref Range   Sodium 134 (L) 135 - 145 mmol/L   Potassium 4.3 3.5 - 5.1 mmol/L   Chloride 98 98 - 111 mmol/L   CO2 23 22 - 32 mmol/L   Glucose, Bld 408 (H) 70 - 99 mg/dL   BUN 26 (H) 6 - 20 mg/dL   Creatinine, Ser 1.63 (H) 0.61 - 1.24 mg/dL   Calcium 7.8 (L) 8.9 - 10.3 mg/dL   Total Protein 7.9 6.5 - 8.1 g/dL   Albumin 3.4 (L) 3.5 - 5.0 g/dL   AST 82 (H) 15 - 41 U/L   ALT 55 (H)  0 - 44 U/L   Alkaline Phosphatase 59 38 - 126 U/L   Total Bilirubin 0.9 0.3 - 1.2 mg/dL   GFR calc non Af Amer 50 (L) >60 mL/min   GFR calc Af Amer 58 (L) >60 mL/min   Anion gap 13 5 - 15  Fibrin derivatives D-Dimer     Status: Abnormal   Collection Time: 04/05/19  4:55 PM  Result Value Ref Range   Fibrin derivatives D-dimer ([facility]) 790.40 (H) 0.00 - 499.00 ng/mL (FEU)  Procalcitonin     Status: None   Collection Time: 04/05/19  4:55 PM  Result Value Ref Range   Procalcitonin 1.51 ng/mL  Lactate dehydrogenase     Status: Abnormal   Collection Time: 04/05/19  4:55 PM  Result Value Ref Range   LDH 548 (H) 98 - 192 U/L  Ferritin     Status: Abnormal   Collection Time: 04/05/19  4:55 PM  Result Value Ref Range   Ferritin 1,481 (H) 24 - 336 ng/mL  Triglycerides     Status: None   Collection Time: 04/05/19  4:55 PM  Result Value Ref Range   Triglycerides 144 <150 mg/dL  Fibrinogen     Status: Abnormal   Collection Time: 04/05/19  4:55 PM  Result Value Ref Range   Fibrinogen 737 (H) 210 - 475 mg/dL  Brain natriuretic peptide     Status: None   Collection Time: 04/05/19  4:55 PM  Result Value Ref Range   B Natriuretic Peptide 50.1 0.0 - 100.0 pg/mL  Lactic acid, plasma     Status: None   Collection Time: 04/05/19  5:03 PM  Result Value Ref Range   Lactic Acid, Venous 1.8 0.5 - 1.9 mmol/L  POC SARS Coronavirus 2 Ag      Status: Abnormal   Collection Time: 04/05/19  6:11 PM  Result Value Ref Range   SARS Coronavirus 2 Ag POSITIVE (A) NEGATIVE   ____________________________________________  EKG My review and personal interpretation at Time: 16:51   Indication: sob  Rate: 120  Rhythm: sinus Axis: normal Other: nonspecific st abn, abnml ekg ____________________________________________  RADIOLOGY  I personally reviewed all radiographic images ordered to evaluate for the above acute complaints and reviewed radiology reports and findings.  These findings were personally discussed with the patient.  Please see medical record for radiology report.   ____________________________________________   PROCEDURES  Procedure(s) performed:  .Critical Care Performed by: Willy Eddyobinson, Concepcion Gillott, MD Authorized by: Willy Eddyobinson, Adri Schloss, MD   Critical care provider statement:    Critical care time (minutes):  35   Critical care time was exclusive of:  Separately billable procedures and treating other patients   Critical care was necessary to treat or prevent imminent or life-threatening deterioration of the following conditions:  Respiratory failure   Critical care was time spent personally by me on the following activities:  Development of treatment plan with patient or surrogate, discussions with consultants, evaluation of patient's response to treatment, examination of patient, obtaining history from patient or surrogate, ordering and performing treatments and interventions, ordering and review of laboratory studies, ordering and review of radiographic studies, pulse oximetry, re-evaluation of patient's condition and review of old charts      Critical Care performed: yes ____________________________________________   INITIAL IMPRESSION / ASSESSMENT AND PLAN / ED COURSE  Pertinent labs & imaging results that were available during my care of the patient were reviewed by me and considered in my medical decision making  (see chart for details).  DDX: covid 19, Asthma, copd, CHF, pna, ptx, malignancy, Pe, anemia   Sava Cloke is a 59 y.o. who presents to the ED with shortness of breath and respiratory distress as described above.  Patient has significant hypoxia but is currently protecting his airway.  Will trial.  Of high flow nasal cannula as I am concerned for COVID-19.  No history of COPD or CHF.  Will send blood work for above differential.  Clinical Course as of Oct 21 1901  Sat Oct 22, 2019  1739 Patient's O2 saturations hovering around 88% on high flow.  He appears more comfortable.   [PR]  1747 Discussed case with ICU attending.  Will tolerate SPO2 of 85% as long as he remains comfortable appearing which he has.  Will order CTA to exclude PE but I think this is primarily related to severe Covid pneumonia.  Will cover with IV antibiotics concern for possible bacterial pneumonia.  Does not appear consistent with CHF.   [PR]  1807 Change the SPO2 probe to his ear and he is now satting 95%..  Lactate is normal.  Based on inflammatory markers thus far this does appear consistent with severe Covid.   [PR]    Clinical Course User Index [PR] Willy Eddy, MD    The patient was evaluated in Emergency Department today for the symptoms described in the history of present illness. He/she was evaluated in the context of the global COVID-19 pandemic, which necessitated consideration that the patient might be at risk for infection with the SARS-CoV-2 virus that causes COVID-19. Institutional protocols and algorithms that pertain to the evaluation of patients at risk for COVID-19 are in a state of rapid change based on information released by regulatory bodies including the CDC and federal and state organizations. These policies and algorithms were followed during the patient's care in the ED.  As part of my medical decision making, I reviewed the following data within the electronic MEDICAL RECORD NUMBER Nursing  notes reviewed and incorporated, Labs reviewed, notes from prior ED visits and [place] Controlled Substance Database   ____________________________________________   FINAL CLINICAL IMPRESSION(S) / ED DIAGNOSES  Final diagnoses:  Pneumonia due to COVID-19 virus  Acute respiratory failure with hypoxia (HCC)      NEW MEDICATIONS STARTED DURING THIS VISIT:  New Prescriptions   No medications on file     Note:  This document was prepared using Dragon voice recognition software and may include unintentional dictation errors.    Willy Eddy, MD 10/26/2019 [phone]

## 2019-10-22 NOTE — ED Notes (Signed)
Pt talking on phone. Additional pillows provided.

## 2019-10-22 NOTE — ED Notes (Signed)
Waiting on trujenta and remdisivir from pharmacy.

## 2019-10-22 NOTE — ED Triage Notes (Signed)
Pt to Ed via [facility] from home. Per Ems pt called out for breathing difficulty. Pt RA sats <50% and does not use oxygen at home. Pt placed on NRB 15L with improvement to 83-92%. Sinus tach with EMS. Afebrile. BP 113/72. Pt stating possible COVID exposure.

## 2019-10-22 NOTE — ED Notes (Signed)
Pillows provided, tv remote, phone, call bell, urinal at bedside. Pt denies other needs. Pt informed this rn will page in on telephone system to check on pt occasionally. Pt verbalizes understanding. Pt tolerating oxygen well. Probe replaced on left ear.

## 2019-10-22 NOTE — H&P (Signed)
History and Physical    Hermilo Dutter FXT:[id number] DOB: May 13, 1960 DOA: 11-21-19  PCP: Preston Fleeting, MD   Patient coming from: Home  I have personally briefly reviewed patient's old medical records in [facility]  Chief Complaint: Weakness, shortness of breath  HPI: Spencer Woodward is a 59 y.o. male with medical history significant for DM, HTN, obesity, who presents to the emergency room with a 2-day history of generalized malaise, shortness of breath, with chest discomfort on deep inspiration.  He has no cough and no fever but has chills.  Was in contact with Covid positive person about 2 weeks prior.  EMS reported an O2 sat of 50% on room air. ED Course: On arrival O2 sat was 83% on room air, patient was tachycardic at 109 borderline hypotension at 97/66 with respiration rate around 17.  He was placed on high flow nasal cannula with improvement in sats to 99%.  Covid PCR positive with elevated inflammatory biomarkers.  Chest x-ray showing patchy bilateral airspace opacities.  The emergency room provider spoke with on-call pulmonologist regarding need for baricitinib however due to rapid improvement in patient's oxygenation, determined not necessary at this time.  Hospitalist consulted for admission  Review of Systems: As per HPI otherwise all other systems on review of systems negative.    Past Medical History:  Diagnosis Date  . Diabetes mellitus without complication [facility])     Past Surgical History:  Procedure Laterality Date  . TONSILECTOMY, ADENOIDECTOMY, BILATERAL MYRINGOTOMY AND TUBES       reports that he has never smoked. He has never used smokeless tobacco. He reports that he does not drink alcohol and does not use drugs.  No Known Allergies  Family History  Problem Relation Age of Onset  . Diabetes Mother   . Heart disease Father   . Alcohol abuse Father   . Prostate cancer Neg Hx   . Bladder Cancer Neg Hx   . Kidney cancer Neg Hx       Prior to  Admission medications   Medication Sig Start Date End Date Taking? Authorizing Provider  amLODipine (NORVASC) 10 MG tablet Take 10 mg by mouth daily.   Yes [provider]  atorvastatin (LIPITOR) 80 MG tablet Take 80 mg by mouth daily.   Yes [provider]  azithromycin (ZITHROMAX) 250 MG tablet Take by mouth. 10/21/19  Yes [provider]  empagliflozin (JARDIANCE) 25 MG TABS tablet Take 25 mg by mouth daily.   Yes [provider]  glipiZIDE (GLUCOTROL XL) 10 MG 24 hr tablet Take 10 mg by mouth daily with breakfast.   Yes [provider]  Insulin Glargine-Lixisenatide (SOLIQUA) 100-33 UNT-MCG/ML SOPN Inject 60 Units into the skin daily.    Yes [provider]  lisinopril (PRINIVIL,ZESTRIL) 40 MG tablet Take 40 mg by mouth daily.   Yes [provider]  metFORMIN (GLUCOPHAGE) 1000 MG tablet Take 1 tablet (1,000 mg total) by mouth 2 (two) times daily with a meal. 05/15/12  Yes Sherren Mocha, MD  Multiple Vitamin (MULTIVITAMIN) tablet Take 1 tablet by mouth daily.   Yes [provider]  sildenafil (VIAGRA) 100 MG tablet Take 100 mg by mouth daily as needed for erectile dysfunction.   Yes [provider]  warfarin (COUMADIN) 1 MG tablet Take 1 mg by mouth daily.   Yes [provider]  warfarin (COUMADIN) 10 MG tablet Take 10 mg by mouth daily.   Yes [provider]  colchicine 0.6  MG tablet Take 0.6 mg by mouth daily.    [provider]  PROAIR HFA 108 [phone] Base) MCG/ACT inhaler Inhale 2 puffs into the lungs every 4 (four) hours as needed. 10/21/19   [provider]    Physical Exam: Vitals:   12-Nov-2019 1658 November 12, 2019 1700 2019-11-12 1800 2019/11/12 1830  BP:  106/67 97/66 104/68  Pulse:  (!) 116 (!) 109 (!) 109  Resp:  11 14 17   Temp:      TempSrc:      SpO2:  (!) 79% (!) 83% 99%  Weight: (!) 149.7 kg     Height: 6\' 3"  (1.905 m)        Vitals:   12-Nov-2019 1658 11-12-19 1700 11-12-2019  1800 12-Nov-2019 1830  BP:  106/67 97/66 104/68  Pulse:  (!) 116 (!) 109 (!) 109  Resp:  11 14 17   Temp:      TempSrc:      SpO2:  (!) 79% (!) 83% 99%  Weight: (!) 149.7 kg     Height: 6\' 3"  (1.905 m)         Constitutional: Alert and oriented x 3 .  Dyspneic with conversation HEENT:      Head: Normocephalic and atraumatic.         Eyes: PERLA, EOMI, Conjunctivae are normal. Sclera is non-icteric.       Mouth/Throat: Mucous membranes are moist.       Neck: Supple with no signs of meningismus. Cardiovascular: Regular rate and rhythm. No murmurs, gallops, or rubs. 2+ symmetrical distal pulses are present . No JVD. No LE edema Respiratory: Respiratory effort increased.  Diminished bilaterally with scattered wheezes Gastrointestinal: Soft, non tender, and non distended with positive bowel sounds. No rebound or guarding. Genitourinary: No CVA tenderness. Musculoskeletal: Nontender with normal range of motion in all extremities. No cyanosis, or erythema of extremities. Neurologic: Normal speech and language. Face is symmetric. Moving all extremities. No gross focal neurologic deficits . Skin: Skin is warm, dry.  No rash or ulcers Psychiatric: Mood and affect are normal Speech and behavior are normal   Labs on Admission: I have personally reviewed following labs and imaging studies  CBC: Recent Labs  Lab 11-12-2019 1655  WBC 10.8*  NEUTROABS 9.9*  HGB 14.4  HCT 39.9  MCV 74.7*  PLT 244   Basic Metabolic Panel: Recent Labs  Lab 2019/11/12 1655  NA 134*  K 4.3  CL 98  CO2 23  GLUCOSE 408*  BUN 26*  CREATININE 1.53*  CALCIUM 7.8*   GFR: Estimated Creatinine Clearance: 83.3 mL/min (A) (by C-G formula based on SCr of 1.53 mg/dL (H)). Liver Function Tests: Recent Labs  Lab Nov 12, 2019 1655  AST 82*  ALT 55*  ALKPHOS 59  BILITOT 0.9  PROT 7.9  ALBUMIN 3.4*   No results for input(s): LIPASE, AMYLASE in the last 168 hours. No results for input(s): AMMONIA in the last 168  hours. Coagulation Profile: No results for input(s): INR, PROTIME in the last 168 hours. Cardiac Enzymes: No results for input(s): CKTOTAL, CKMB, CKMBINDEX, TROPONINI in the last 168 hours. BNP (last 3 results) No results for input(s): PROBNP in the last 8760 hours. HbA1C: No results for input(s): HGBA1C in the last 72 hours. CBG: No results for input(s): GLUCAP in the last 168 hours. Lipid Profile: Recent Labs    11/12/2019 1655  TRIG 144   Thyroid Function Tests: No results for input(s): TSH, T4TOTAL, FREET4, T3FREE, THYROIDAB in the last 72 hours.  Anemia Panel: Recent Labs    2019/11/09 1655  FERRITIN 1,481*   Urine analysis:    Component Value Date/Time   COLORURINE Straw 12/16/2011 1508   APPEARANCEUR Clear 12/16/2011 1508   LABSPEC 1.028 12/16/2011 1508   PHURINE 5.0 12/16/2011 1508   GLUCOSEU >=500 12/16/2011 1508   HGBUR Negative 12/16/2011 1508   BILIRUBINUR Negative 12/16/2011 1508   KETONESUR 1+ 12/16/2011 1508   PROTEINUR Negative 12/16/2011 1508   NITRITE Negative 12/16/2011 1508   LEUKOCYTESUR Negative 12/16/2011 1508    Radiological Exams on Admission: DG Chest Port 1 View  Result Date: 09-Nov-2019 CLINICAL DATA:  Possible COVID exposure. EXAM: PORTABLE CHEST 1 VIEW COMPARISON:  Chest radiograph December 07, 2018. FINDINGS: Exam limited secondary to technique and exposure. Stable cardiac and mediastinal contours. Interval development of patchy bilateral airspace opacities. No pleural effusion or pneumothorax. IMPRESSION: Interval development of patchy bilateral airspace opacities which may represent pneumonia. Recommend repeat chest radiograph with PA and lateral chest radiograph when patient clinically able. Electronically Signed   By: Annia Belt M.D.   On: Nov 09, 2019 17:17    EKG: Independently reviewed. Interpretation : Sinus tachycardia with nonspecific ST-T wave changes  Assessment/Plan 59 year old male with a history of DM, HTN, morbid obesity,  admitted with COVID-19 pneumonia with hypoxia after presenting with 2-day history of generalized malaise, shortness of breath, with chest discomfort on deep inspiration.    Acute respiratory failure due to COVID-19 [facility])   Pneumonia due to COVID-19 virus -Covid PCR positive, chest x-ray showing pneumonia, O2 sat reportedly 50% with EMS, requiring high flow in the ER -Continue O2.  Currently on high flow with sats up to the high 90s -Proning as tolerated -Continue remdesivir, dexamethasone, albuterol, antitussives, vitamins -ER provider spoke with pulmonologist regarding baricitinib put on hold for right now -Inflammatory biomarkers were elevated, continue to trend to gauge severity and guide therapy    HTN (hypertension) -Hold antihypertensives due to soft blood pressures    Diabetes mellitus without complication (HCC) -Sliding scale insulin    Obesity, Class III, BMI 40-49.9 (morbid obesity) (HCC) -This complicates overall prognosis and care    History of DVT (deep vein thrombosis) -D-dimer slightly elevated but likely all related to Covid -Continue therapeutic dose Lovenox    DVT prophylaxis: Lovenox  Code Status: full code  Family Communication:  none  Disposition Plan: Back to previous home environment Consults called: non  Status:At the time of admission, it appears that the appropriate admission status for this patient is INPATIENT. This is judged to be reasonable and necessary in order to provide the required intensity of service to ensure the patient's safety given the presenting symptoms, physical exam findings, and initial radiographic and laboratory data in the context of their  Comorbid conditions.   Patient requires inpatient status due to high intensity of service, high risk for further deterioration and high frequency of surveillance required.   I certify that at the point of admission it is my clinical judgment that the patient will require inpatient hospital care  spanning beyond 2 midnights     Andris Baumann MD [facility]     09-Nov-2019, 7:17 PM

## 2019-10-22 NOTE — ED Notes (Signed)
Pt requesting ice chips, tv remote, phone.

## 2019-10-23 LAB — BLOOD GAS, ARTERIAL
Acid-base deficit: 0.7 mmol/L (ref 0.0–2.0)
Bicarbonate: 24.2 mmol/L (ref 20.0–28.0)
FIO2: 1
O2 Saturation: 92.5 %
Patient temperature: 37
pCO2 arterial: 40 mmHg (ref 32.0–48.0)
pH, Arterial: 7.39 (ref 7.350–7.450)
pO2, Arterial: 66 mmHg — ABNORMAL LOW (ref 83.0–108.0)

## 2019-10-23 LAB — HEMOGLOBIN A1C
Hgb A1c MFr Bld: 9.4 % — ABNORMAL HIGH (ref 4.8–5.6)
Mean Plasma Glucose: 223.08 mg/dL

## 2019-10-23 LAB — PROCALCITONIN: Procalcitonin: 1.42 ng/mL

## 2019-10-23 LAB — PROTIME-INR
INR: 4.3 (ref 0.8–1.2)
Prothrombin Time: 40.3 seconds — ABNORMAL HIGH (ref 11.4–15.2)

## 2019-10-23 LAB — GLUCOSE, CAPILLARY
Glucose-Capillary: 425 mg/dL — ABNORMAL HIGH (ref 70–99)
Glucose-Capillary: 377 mg/dL — ABNORMAL HIGH (ref 70–99)
Glucose-Capillary: 391 mg/dL — ABNORMAL HIGH (ref 70–99)
Glucose-Capillary: 348 mg/dL — ABNORMAL HIGH (ref 70–99)
Glucose-Capillary: 265 mg/dL — ABNORMAL HIGH (ref 70–99)

## 2019-10-23 LAB — C-REACTIVE PROTEIN: CRP: 26.5 mg/dL — ABNORMAL HIGH (ref <1.0)

## 2019-10-23 LAB — APTT: aPTT: 131 seconds — ABNORMAL HIGH (ref 24–36)

## 2019-10-23 LAB — ABO/RH: ABO/RH(D): B POS

## 2019-10-23 MED ORDER — ASCORBIC ACID 500 MG PO TABS
500.0000 mg | ORAL_TABLET | Freq: Every day | ORAL | Status: DC
  Administered 2019-10-23 – 2019-11-01 (×7): 500 mg via ORAL
  Filled 2019-10-23 (×8): qty 1

## 2019-10-23 MED ORDER — BARICITINIB 2 MG PO TABS
4.0000 mg | ORAL_TABLET | Freq: Every day | ORAL | Status: DC
  Administered 2019-10-23: 4 mg via ORAL
  Filled 2019-10-23 (×2): qty 2

## 2019-10-23 MED ORDER — HYDROCOD POLST-CPM POLST ER 10-8 MG/5ML PO SUER: 5.0000 mL | Freq: Two times a day (BID) | ORAL | Status: DC | PRN

## 2019-10-23 MED ORDER — ENOXAPARIN SODIUM 40 MG/0.4ML ~~LOC~~ SOLN
40.0000 mg | Freq: Two times a day (BID) | SUBCUTANEOUS | Status: DC
  Administered 2019-10-23: 40 mg via SUBCUTANEOUS
  Filled 2019-10-23: qty 0.4

## 2019-10-23 MED ORDER — DEXAMETHASONE SODIUM PHOSPHATE 10 MG/ML IJ SOLN
6.0000 mg | INTRAMUSCULAR | Status: DC
  Administered 2019-10-23 – 2019-10-27 (×5): 6 mg via INTRAVENOUS
  Filled 2019-10-23 (×4): qty 0.6
  Filled 2019-10-23 (×2): qty 1

## 2019-10-23 MED ORDER — ADULT MULTIVITAMIN W/MINERALS CH
1.0000 | ORAL_TABLET | Freq: Every day | ORAL | Status: DC
  Administered 2019-10-23 – 2019-11-01 (×6): 1 via ORAL
  Filled 2019-10-23 (×7): qty 1

## 2019-10-23 MED ORDER — ALBUTEROL SULFATE HFA 108 (90 BASE) MCG/ACT IN AERS
2.0000 | INHALATION_SPRAY | Freq: Four times a day (QID) | RESPIRATORY_TRACT | Status: DC
  Administered 2019-10-23 – 2019-10-25 (×10): 2 via RESPIRATORY_TRACT
  Filled 2019-10-23: qty 6.7

## 2019-10-23 MED ORDER — ATORVASTATIN CALCIUM 20 MG PO TABS
80.0000 mg | ORAL_TABLET | Freq: Every day | ORAL | Status: DC
  Administered 2019-10-23 – 2019-10-28 (×3): 80 mg via ORAL
  Filled 2019-10-23: qty 4
  Filled 2019-10-23: qty 8
  Filled 2019-10-23: qty 4

## 2019-10-23 MED ORDER — THIAMINE HCL 100 MG/ML IJ SOLN
100.0000 mg | Freq: Every day | INTRAMUSCULAR | Status: DC
  Administered 2019-10-23 – 2019-11-02 (×11): 100 mg via INTRAVENOUS
  Filled 2019-10-23 (×11): qty 2

## 2019-10-23 MED ORDER — GUAIFENESIN-DM 100-10 MG/5ML PO SYRP
10.0000 mL | ORAL_SOLUTION | ORAL | Status: DC | PRN
  Filled 2019-10-23: qty 10

## 2019-10-23 MED ORDER — ASCORBIC ACID 500 MG/ML IJ SOLN: 500.0000 mg | Freq: Every day | INTRAMUSCULAR | Status: DC

## 2019-10-23 MED ORDER — INSULIN ASPART 100 UNIT/ML ~~LOC~~ SOLN
0.0000 [IU] | SUBCUTANEOUS | Status: DC
  Administered 2019-10-23: 15 [IU] via SUBCUTANEOUS
  Administered 2019-10-23 (×3): 20 [IU] via SUBCUTANEOUS
  Administered 2019-10-23: 11 [IU] via SUBCUTANEOUS
  Administered 2019-10-24: 4 [IU] via SUBCUTANEOUS
  Administered 2019-10-24: 11 [IU] via SUBCUTANEOUS
  Administered 2019-10-24: 15 [IU] via SUBCUTANEOUS
  Administered 2019-10-24 (×2): 11 [IU] via SUBCUTANEOUS
  Administered 2019-10-25: 4 [IU] via SUBCUTANEOUS
  Administered 2019-10-25: 11 [IU] via SUBCUTANEOUS
  Administered 2019-10-25 (×2): 7 [IU] via SUBCUTANEOUS
  Administered 2019-10-25: 4 [IU] via SUBCUTANEOUS
  Administered 2019-10-25: 7 [IU] via SUBCUTANEOUS
  Administered 2019-10-25 – 2019-10-26 (×6): 4 [IU] via SUBCUTANEOUS
  Administered 2019-10-27: 7 [IU] via SUBCUTANEOUS
  Administered 2019-10-27: 4 [IU] via SUBCUTANEOUS
  Administered 2019-10-27 (×4): 7 [IU] via SUBCUTANEOUS
  Administered 2019-10-27: 4 [IU] via SUBCUTANEOUS
  Administered 2019-10-28: 7 [IU] via SUBCUTANEOUS
  Administered 2019-10-28: 4 [IU] via SUBCUTANEOUS
  Administered 2019-10-28 (×2): 7 [IU] via SUBCUTANEOUS
  Filled 2019-10-23 (×34): qty 1

## 2019-10-23 MED ORDER — ONDANSETRON HCL 4 MG PO TABS: 4.0000 mg | ORAL_TABLET | Freq: Four times a day (QID) | ORAL | Status: DC | PRN

## 2019-10-23 MED ORDER — IPRATROPIUM-ALBUTEROL 20-100 MCG/ACT IN AERS
1.0000 | INHALATION_SPRAY | Freq: Four times a day (QID) | RESPIRATORY_TRACT | Status: DC
  Administered 2019-10-23 – 2019-10-25 (×9): 1 via RESPIRATORY_TRACT
  Filled 2019-10-23: qty 4

## 2019-10-23 MED ORDER — WARFARIN - PHARMACIST DOSING INPATIENT
Freq: Every day | Status: DC
  Filled 2019-10-23: qty 1

## 2019-10-23 MED ORDER — ACETAMINOPHEN 325 MG PO TABS: 650.0000 mg | ORAL_TABLET | Freq: Four times a day (QID) | ORAL | Status: DC | PRN

## 2019-10-23 MED ORDER — ASCORBIC ACID 500 MG PO TABS: 500.0000 mg | ORAL_TABLET | Freq: Every day | ORAL | Status: DC

## 2019-10-23 MED ORDER — ZINC SULFATE 220 (50 ZN) MG PO CAPS
220.0000 mg | ORAL_CAPSULE | Freq: Every day | ORAL | Status: DC
  Administered 2019-10-23 – 2019-11-01 (×7): 220 mg via ORAL
  Filled 2019-10-23 (×8): qty 1

## 2019-10-23 MED ORDER — SODIUM CHLORIDE 0.9 % IV SOLN: 100.0000 mg | Freq: Every day | INTRAVENOUS | Status: DC

## 2019-10-23 MED ORDER — COLCHICINE 0.6 MG PO TABS
0.6000 mg | ORAL_TABLET | Freq: Every day | ORAL | Status: DC
  Administered 2019-10-23 – 2019-10-28 (×3): 0.6 mg via ORAL
  Filled 2019-10-23 (×6): qty 1

## 2019-10-23 MED ORDER — ONDANSETRON HCL 4 MG/2ML IJ SOLN: 4.0000 mg | Freq: Four times a day (QID) | INTRAMUSCULAR | Status: DC | PRN

## 2019-10-23 MED ORDER — LISINOPRIL 10 MG PO TABS
40.0000 mg | ORAL_TABLET | Freq: Every day | ORAL | Status: DC
  Administered 2019-10-23: 40 mg via ORAL
  Filled 2019-10-23: qty 4

## 2019-10-23 MED ORDER — AMLODIPINE BESYLATE 10 MG PO TABS
10.0000 mg | ORAL_TABLET | Freq: Every day | ORAL | Status: DC
  Administered 2019-10-23 – 2019-10-24 (×2): 10 mg via ORAL
  Filled 2019-10-23 (×3): qty 2

## 2019-10-23 MED ORDER — INSULIN ASPART 100 UNIT/ML ~~LOC~~ SOLN
3.0000 [IU] | Freq: Once | SUBCUTANEOUS | Status: AC
  Administered 2019-10-23: 3 [IU] via SUBCUTANEOUS
  Filled 2019-10-23: qty 1

## 2019-10-23 MED ORDER — SODIUM CHLORIDE 0.9 % IV SOLN: 200.0000 mg | Freq: Once | INTRAVENOUS | Status: DC

## 2019-10-23 MED ORDER — INSULIN GLARGINE 100 UNIT/ML ~~LOC~~ SOLN
60.0000 [IU] | Freq: Every day | SUBCUTANEOUS | Status: DC
  Administered 2019-10-23 – 2019-10-25 (×3): 60 [IU] via SUBCUTANEOUS
  Filled 2019-10-23 (×3): qty 0.6

## 2019-10-23 NOTE — ED Notes (Signed)
Spoke with [employer], np on phone, notified of pc02 of 19. No new verbal orders received.

## 2019-10-23 NOTE — Progress Notes (Signed)
Remdesivir - Pharmacy Brief Note   O:  CXR: IMPRESSION: Slightly suboptimal opacification of the main pulmonary artery, however no central or proximal segmental pulmonary embolism.  Extensive multifocal airspace opacities, consistent with atypical viral pneumonia  Heterogeneous right thyroid lobe lesion measuring 4.3 x 3.1 cm. Recommend thyroid US (ref: J Am Coll Radiol. 2015 Feb;12(2): 143-50). SpO2: 86% on 50L/min HFNC w/ 100% FiO2   A/P:  Remdesivir 200 mg IVPB once followed by 100 mg IVPB daily x 4 days.   08/15 @ 0330 patient is being started on baricitinib 4 mg daily x 14 days d/t increased oxygen requirements and declining O2 sats.  Thomasene Ripple, PharmD, BCPS Clinical Pharmacist 10/23/2019 3:24 AM

## 2019-10-23 NOTE — ED Notes (Signed)
Secure chat sent to dr. Para March and Jon Billings, np regarding pox in mid 80s to 90% on oxygen as charted. Awaiting response from [place], np.

## 2019-10-23 NOTE — Progress Notes (Signed)
PROGRESS NOTE    Spencer Woodward  JKD:[id number] DOB: May 21, 1960 DOA: 10/10/2019 PCP: Preston Fleeting, MD   Brief Narrative:  Spencer Woodward is a 59 y.o. male with medical history significant for DM, HTN, PE on Coumadin and obesity, who presents to the emergency room with a 2-day history of generalized malaise, shortness of breath, with chest discomfort on deep inspiration.  No cough or fever, only chills.  Positive Covid 19 contact.  Patient is unvaccinated.  Covid PCR positive with elevated inflammatory markers.  Hypoxic requiring heated HFNC at 60 L with FiO2 of 100%.CTA with slightly suboptimal opacification of main pulmonary artery but no central or proximal segmental PE, bilateral groundglass opacities consistent with COVID-19 pneumonia, INR supratherapeutic at 4.3 as patient was on Coumadin due to his history of prior PE.  Patient is a Naval architect. He was started on remdesivir, Decadron and baricitinib.  Pulmonary was consulted.  Subjective: Patient still feeling quite short of breath and coughing.  Stating that he was not coughing at home but cough started while he was in hospital.  Assessment & Plan:   Principal Problem:   Acute respiratory failure due to COVID-19 [facility]) Active Problems:   HTN (hypertension)   Diabetes mellitus without complication (HCC)   Obesity, Class III, BMI 40-49.9 (morbid obesity) (HCC)   Pneumonia due to COVID-19 virus   History of DVT (deep vein thrombosis)  Acute hypoxic respiratory failure secondary to COVID-19 pneumonia. Elevated inflammatory markers, procalcitonin at 1.51.  Patient was started on ceftriaxone and Zithromax for superadded bacterial infection. -We will check for MRSA screen-if positive we will cover him for MRSA pneumonia too. -Continue remdesivir and Decadron-day 2 -Continue baricitinib. -Continue ceftriaxone and Zithromax. -Continue to monitor inflammatory markers. -Continue supportive care. -Continue with supplemental oxygen  with heated HFNC-we will try to wean if tolerated.  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  Repeat CTA with some opacification of mid pulmonary artery, may be chronic.  Patient has supratherapeutic INR today at 4.3.  He was on Coumadin and also been started on Zithromax.  No sign of bleeding. 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 -Pharmacy is managing her Coumadin.  Hypertension.  Blood pressure elevated now.  Initially his home dose of antihypertensives were held due to softer blood pressure.  Type 2 diabetes mellitus.  CBG elevated. -Restart home Levemir. -Continue with sliding scale.  Morbid obesity. Body mass index is 41.25 kg/m.  This will complicate overall prognosis.   Objective: Vitals:   10/23/19 0900 10/23/19 0930 10/23/19 1000 10/23/19 1100  BP: 139/84 (!) 128/107 (!) 162/81 (!) 143/88  Pulse: (!) 108 (!) 105 (!) 112 (!) 107  Resp: (!) 24 (!) 26    Temp:      TempSrc:      SpO2: 91% 94% (!) 89% 93%  Weight:      Height:        Intake/Output Summary (Last 24 hours) at 10/23/2019 1123 Last data filed at 10/23/2019 0998 Gross per 24 hour  Intake 2090 ml  Output --  Net 2090 ml   Filed Weights   11/06/2019 1658  Weight: (!) 149.7 kg    Examination:  General exam: Morbidly obese gentleman, appears calm and comfortable  Respiratory system: Few scattered rhonchi. Respiratory effort normal. Cardiovascular system: S1 & S2 heard, RRR. No JVD, murmurs, Gastrointestinal system: Soft, nontender, nondistended, bowel sounds positive. Central nervous system: Alert and  oriented. No focal neurological deficits.Symmetric 5 x 5 power. Extremities: No edema, no cyanosis, pulses intact and symmetrical. Skin: No rashes, lesions or ulcers Psychiatry: Judgement and insight appear normal. Mood & affect appropriate.    DVT prophylaxis: Coumadin Code Status: Full Family Communication: Discussed  with patient. Disposition Plan:  Status is: Inpatient  Remains inpatient appropriate because:Inpatient level of care appropriate due to severity of illness   Dispo: The patient is from: Home              Anticipated d/c is to: Home              Anticipated d/c date is: 3 days              Patient currently is not medically stable to d/c.  Consultants:   Pulmonary  Procedures:  Antimicrobials:  Ceftriaxone Zithromax  Data Reviewed: I have personally reviewed following labs and imaging studies  CBC: Recent Labs  Lab 11-07-19 1655  WBC 10.8*  NEUTROABS 9.9*  HGB 14.4  HCT 39.9  MCV 74.7*  PLT 244   Basic Metabolic Panel: Recent Labs  Lab 2019/11/07 1655  NA 134*  K 4.3  CL 98  CO2 23  GLUCOSE 408*  BUN 26*  CREATININE 1.53*  CALCIUM 7.8*   GFR: Estimated Creatinine Clearance: 83.3 mL/min (A) (by C-G formula based on SCr of 1.53 mg/dL (H)). Liver Function Tests: Recent Labs  Lab 11-07-19 1655  AST 82*  ALT 55*  ALKPHOS 59  BILITOT 0.9  PROT 7.9  ALBUMIN 3.4*   No results for input(s): LIPASE, AMYLASE in the last 168 hours. No results for input(s): AMMONIA in the last 168 hours. Coagulation Profile: Recent Labs  Lab 10/23/19 0814  INR 4.3*   Cardiac Enzymes: No results for input(s): CKTOTAL, CKMB, CKMBINDEX, TROPONINI in the last 168 hours. BNP (last 3 results) No results for input(s): PROBNP in the last 8760 hours. HbA1C: No results for input(s): HGBA1C in the last 72 hours. CBG: Recent Labs  Lab Nov 07, 2019 2048 10/23/19 0453 10/23/19 0837  GLUCAP 370* 425* 377*   Lipid Profile: Recent Labs    11/07/19 1655  TRIG 144   Thyroid Function Tests: No results for input(s): TSH, T4TOTAL, FREET4, T3FREE, THYROIDAB in the last 72 hours. Anemia Panel: Recent Labs    2019/11/07 1655  FERRITIN 1,481*   Sepsis Labs: Recent Labs  Lab 11-07-2019 1655 07-Nov-2019 1703 2019-11-07 1928 10/23/19 0708  PROCALCITON 1.51  --   --  1.42  LATICACIDVEN   --  1.8 1.4  --     Recent Results (from the past 240 hour(s))  Blood Culture (routine x 2)     Status: None (Preliminary result)   Collection Time: Nov 07, 2019  4:55 PM   Specimen: BLOOD  Result Value Ref Range Status   Specimen Description BLOOD BLOOD LEFT FOREARM  Final   Special Requests   Final    BOTTLES DRAWN AEROBIC AND ANAEROBIC Blood Culture results may not be optimal due to an excessive volume of blood received in culture bottles   Culture   Final    NO GROWTH < 24 HOURS Performed at [facility], [address], [place], [place] [address]    Report Status PENDING  Incomplete  SARS Coronavirus 2 by RT PCR (hospital order, performed in [facility] hospital lab) Nasopharyngeal Nasopharyngeal Swab     Status: Abnormal   Collection Time: 11/07/19  5:03 PM   Specimen: Nasopharyngeal Swab  Result Value Ref  Range Status   SARS Coronavirus 2 POSITIVE (A) NEGATIVE Final    Comment: RESULT CALLED TO, READ BACK BY AND VERIFIED WITH: APRIL BRUMGARD 11/01/2019 AT 2027 HS (NOTE) SARS-CoV-2 target nucleic acids are DETECTED  SARS-CoV-2 RNA is generally detectable in upper respiratory specimens  during the acute phase of infection.  Positive results are indicative  of the presence of the identified virus, but do not rule out bacterial infection or co-infection with other pathogens not detected by the test.  Clinical correlation with patient history and  other diagnostic information is necessary to determine patient infection status.  The expected result is negative.  Fact Sheet for Patients:   BoilerBrush.com.cy   Fact Sheet for Healthcare Providers:   https://pope.com/    This test is not yet approved or cleared by the [place] FDA and  has been authorized for detection and/or diagnosis of SARS-CoV-2 by FDA under an Emergency Use Authorization (EUA).  This EUA will remain in effect (meaning this tes t can be used) for the  duration of  the COVID-19 declaration under Section 564(b)(1) of the Act, 21 U.S.C. section 360-bbb-3(b)(1), unless the authorization is terminated or revoked sooner.  Performed at [facility], [address] [place], [place] [address]   Blood Culture (routine x 2)     Status: None (Preliminary result)   Collection Time: 10/10/2019  6:01 PM   Specimen: BLOOD  Result Value Ref Range Status   Specimen Description BLOOD RH  Final   Special Requests   Final    BOTTLES DRAWN AEROBIC AND ANAEROBIC Blood Culture adequate volume   Culture   Final    NO GROWTH < 12 HOURS Performed at [facility], [address], [place], [place] [address]    Report Status PENDING  Incomplete     Radiology Studies: CT Angio Chest PE W and/or Wo Contrast  Result Date: 10/23/2019 CLINICAL DATA:  Shortness of breath COVID positive EXAM: CT ANGIOGRAPHY CHEST WITH CONTRAST TECHNIQUE: Multidetector CT imaging of the chest was performed using the standard protocol during bolus administration of intravenous contrast. Multiplanar CT image reconstructions and MIPs were obtained to evaluate the vascular anatomy. CONTRAST:  43mL OMNIPAQUE IOHEXOL 350 MG/ML SOLN COMPARISON:  None. FINDINGS: Cardiovascular: There is slightly suboptimal opacification of the main pulmonary artery. No central or proximal segmental pulmonary embolism. The heart is normal in size. No pericardial effusion or thickening. No evidence right heart strain. There is normal three-vessel brachiocephalic anatomy without proximal stenosis. The thoracic aorta is normal in appearance. Mediastinum/Nodes: No hilar, mediastinal, or axillary adenopathy. There is a heterogeneously hypodense lesion seen within the right thyroid lobe measuring 4.3 x 3.1 cm. Lungs/Pleura: Extensive multifocal patchy airspace opacities are seen throughout both lungs. There are air bronchogram seen at both lung bases. No pleural effusion or pneumothorax. Upper Abdomen: No  acute abnormalities present in the visualized portions of the upper abdomen. Musculoskeletal: No chest wall abnormality. No acute or significant osseous findings. Review of the MIP images confirms the above findings. IMPRESSION: Slightly suboptimal opacification of the main pulmonary artery, however no central or proximal segmental pulmonary embolism. Extensive multifocal airspace opacities, consistent with atypical viral pneumonia Heterogeneous right thyroid lobe lesion measuring 4.3 x 3.1 cm. Recommend thyroid US (ref: J Am Coll Radiol. 2015 Feb;12(2): 143-50). Electronically Signed   By: Jonna Clark M.D.   On: 10/31/2019 21:23   DG Chest Port 1 View  Result Date: 10/21/2019 CLINICAL DATA:  Possible COVID exposure. EXAM: PORTABLE CHEST  1 VIEW COMPARISON:  Chest radiograph December 07, 2018. FINDINGS: Exam limited secondary to technique and exposure. Stable cardiac and mediastinal contours. Interval development of patchy bilateral airspace opacities. No pleural effusion or pneumothorax. IMPRESSION: Interval development of patchy bilateral airspace opacities which may represent pneumonia. Recommend repeat chest radiograph with PA and lateral chest radiograph when patient clinically able. Electronically Signed   By: Annia Beltrew  Davis M.D.   On: 11/01/2019 17:17    Scheduled Meds: . albuterol  2 puff Inhalation Q6H  . amLODipine  10 mg Oral Daily  . vitamin C  500 mg Oral Daily  . atorvastatin  80 mg Oral Daily  . baricitinib  4 mg Oral Daily  . colchicine  0.6 mg Oral Daily  . dexamethasone (DECADRON) injection  6 mg Intravenous Q24H  . insulin aspart  0-20 Units Subcutaneous Q4H  . insulin glargine  60 Units Subcutaneous Daily  . Ipratropium-Albuterol  1 puff Inhalation Q6H  . linagliptin  5 mg Oral Daily  . lisinopril  40 mg Oral Daily  . multivitamin with minerals  1 tablet Oral Daily  . thiamine injection  100 mg Intravenous Daily  . zinc sulfate  220 mg Oral Daily   Continuous Infusions: .  azithromycin Stopped (10/09/2019 1925)  . cefTRIAXone (ROCEPHIN)  IV Stopped (11/08/2019 1813)  . remdesivir 100 mg in NS 100 mL 100 mg (10/23/19 1022)     LOS: 1 day   Time spent: 45 minutes.  Arnetha CourserSumayya Shykeria Sakamoto, MD [facility]  If 7PM-7AM, please contact night-coverage [email or web address]  10/23/2019, 11:23 AM   This record has been created using Conservation officer, historic buildingsDragon voice recognition software. Errors have been sought and corrected,but may not always be located. Such creation errors do not reflect on the standard of care.

## 2019-10-23 NOTE — ED Notes (Signed)
Pt SOB and tachy after standing to urinate, sats in 60s to 70s, NRB added at 25 LPM

## 2019-10-23 NOTE — Consult Note (Addendum)
Name: Spencer Woodward MRN: [id number] DOB: 1960-09-17    ADMISSION DATE:  10/11/2019   CONSULTATION DATE: 10/23/2019  REFERRING MD: Lindajo Royal, MD    CHIEF COMPLAINT: Shortness of breath  BRIEF PATIENT DESCRIPTION: 59 y.o male admitted Acute Hypoxic Respiratory Failure secondary to COVID Pneumonia  SIGNIFICANT EVENTS  08/14: Pt admitted to stepdown unit with COVID pneumonia  STUDIES:  8/14:Chest XrayshowsInterval development of patchy bilateral airspace opacities which may represent pneumonia 8/14: CTA Chest Slightly suboptimal opacification of the main pulmonary artery, however no central or proximal segmental pulmonary embolism.Extensive multifocal airspace opacities, consistent with atypical viral pneumonia  CULTURES: 8/14:BCxpending  ANTIBIOTICS: Ceftriaxone 8/14> Azithromycin 8/14>   HISTORY OF PRESENT ILLNESS: 59 y.o male with PMH significant for uncontrolled type 2 DM, HLD, HTN, Thyroid nodule, gout, morbid obesity and pulmonary embolism presented to [facility] ER on 08/14 via EMS from home with generalized malaise, shortness of breath and chest discomfort with inspiration.  Per ED reports, EMS was called due to breathing difficulty and hypoxia with sats <50% on RA. Patient stated that he may have been exposed to COVID about 2 weeks prior. Patient was placed on NRB 15L with improvement in sats to 83-92%.  On arrival to the ED, he was afebrile with blood pressure 108/73 mm Hg and pulse rate 119 beats/min, RR 32  with oxygen saturation in the upper 90's on HFNC. Initial lab results revealed WBC 10.8,  glucose 408, BUN 26, Cr 1.53, AST 92, ALT 55,  ferritin 1481, LDH 548, pct 1.51, fibrinogen >737, d-dimer 790.40. COVID-19 positive and CXR concerning for diffuse patchy bilateral airspace opacities consistent with pneumonia. Follow up CTA Chest was obtained which showed extensive multifocal airspace opacities, consistent with atypical viral pneumonia otherwise for PE. Patient was  discussed with on call pulmonologist regarding need for Baricitinib however due to rapid clinical improved, he was deemed not a candidate. Patient was started on iv decadron and remdesivir.  He was subsequently admitted to stepdown unit per hospitalist team for additional workup and treatment. Patient subsequently developed worsening hypoxia requiring increased oxygenation while still holding in the ED. He is being started on baricitinib 4 mg daily x 14 days d/t increased oxygen requirements and declining O2 sats.  PCCM team consulted to assist with further management.     PAST MEDICAL HISTORY :   has a past medical history of Diabetes mellitus without complication (HCC).  has a past surgical history that includes Tonsilectomy, adenoidectomy, bilateral myringotomy and tubes. Prior to Admission medications   Medication Sig Start Date End Date Taking? Authorizing Provider  amLODipine (NORVASC) 10 MG tablet Take 10 mg by mouth daily.   Yes [provider]  atorvastatin (LIPITOR) 80 MG tablet Take 80 mg by mouth daily.   Yes [provider]  azithromycin (ZITHROMAX) 250 MG tablet Take by mouth. 10/21/19  Yes [provider]  empagliflozin (JARDIANCE) 25 MG TABS tablet Take 25 mg by mouth daily.   Yes [provider]  glipiZIDE (GLUCOTROL XL) 10 MG 24 hr tablet Take 10 mg by mouth daily with breakfast.   Yes [provider]  Insulin Glargine-Lixisenatide (SOLIQUA) 100-33 UNT-MCG/ML SOPN Inject 60 Units into the skin daily.    Yes [provider]  lisinopril (PRINIVIL,ZESTRIL) 40 MG tablet Take 40 mg by mouth daily.   Yes [provider]  metFORMIN (GLUCOPHAGE) 1000 MG tablet Take 1 tablet (1,000 mg total) by mouth 2 (two) times daily with a meal. 05/15/12  Yes Sherren Mocha,  MD  Multiple Vitamin (MULTIVITAMIN) tablet Take 1 tablet by mouth daily.   Yes [provider]  sildenafil (VIAGRA) 100 MG tablet Take 100 mg by mouth daily as needed  for erectile dysfunction.   Yes [provider]  warfarin (COUMADIN) 1 MG tablet Take 1 mg by mouth daily.   Yes [provider]  warfarin (COUMADIN) 10 MG tablet Take 10 mg by mouth daily.   Yes [provider]  colchicine 0.6 MG tablet Take 0.6 mg by mouth daily.    [provider]  PROAIR HFA 108 [phone] Base) MCG/ACT inhaler Inhale 2 puffs into the lungs every 4 (four) hours as needed. 10/21/19   [provider]   No Known Allergies  FAMILY HISTORY:  family history includes Alcohol abuse in his father; Diabetes in his mother; Heart disease in his father. SOCIAL HISTORY:  reports that he has never smoked. He has never used smokeless tobacco. He reports that he does not drink alcohol and does not use drugs.   REVIEW OF SYSTEMS:   Constitutional: Negative for fever, chills, weight loss, malaise/fatigue and diaphoresis.  HENT: Negative for hearing loss, ear pain, nosebleeds, congestion, sore throat, neck pain, tinnitus and ear discharge.   Eyes: Negative for blurred vision, double vision, photophobia, pain, discharge and redness.  Respiratory: Positive for cough, shortness of breath. Negative for hemoptysis, sputum production, wheezing and stridor.   Cardiovascular: positive for chest pain with insipiration. Negative for palpitations, orthopnea, claudication, leg swelling and PND.  Gastrointestinal: Negative for heartburn, nausea, vomiting, abdominal pain, diarrhea, constipation, blood in stool and melena.  Genitourinary: Negative for dysuria, urgency, frequency, hematuria and flank pain.  Musculoskeletal: Negative for myalgias, back pain, joint pain and falls.  Skin: Negative for itching and rash.  Neurological: Negative for dizziness, tingling, tremors, sensory change, speech change, focal weakness, seizures, loss of consciousness, weakness and headaches.  Endo/Heme/Allergies: Negative for environmental allergies and polydipsia. Does not bruise/bleed  easily.  VITAL SIGNS: Temp:  [98.3 F (36.8 C)-99.4 F (37.4 C)] 99.4 F (37.4 C) (08/15 0420) Pulse Rate:  [91-119] 99 (08/15 0442) Resp:  [10-34] 34 (08/15 0442) BP: (97-143)/(66-80) 116/71 (08/15 0430) SpO2:  [78 %-100 %] 98 % (08/15 0442) FiO2 (%):  [100 %] 100 % (08/15 0420) Weight:  [149.7 kg] 149.7 kg (08/14 1658)  PHYSICAL EXAMINATION: GENERAL: 59 year-old patient lying in the bed with no acute distress.  EYES: Pupils equal, round, reactive to light and accommodation. No scleral icterus. Extraocular muscles intact.  HEENT: Head atraumatic, normocephalic. Oropharynx and nasopharynx clear.  NECK:  Supple, no jugular venous distention. No thyroid enlargement, no tenderness.  LUNGS: Decreased breath sounds bilaterally, no wheezing, rales,rhonchi or crepitation. No use of accessory muscles of respiration.  CARDIOVASCULAR: S1, S2 normal. No murmurs, rubs, or gallops.  ABDOMEN: Soft, nontender, nondistended. Bowel sounds present. No organomegaly or mass.  EXTREMITIES: No pedal edema, cyanosis, or clubbing.  NEUROLOGIC: Cranial nerves II through XII are intact. Muscle strength 5/5 in all extremities. Sensation intact. Gait not checked.  PSYCHIATRIC: The patient is alert and oriented x 3.  SKIN: No obvious rash, lesion, or ulcer.   Recent Labs  Lab 11/01/2019 1655  NA 134*  K 4.3  CL 98  CO2 23  BUN 26*  CREATININE 1.53*  GLUCOSE 408*   Recent Labs  Lab 11/01/2019 1655  HGB 14.4  HCT 39.9  WBC 10.8*  PLT 244   CT Angio Chest PE W and/or Wo Contrast  Result Date: 10/09/2019  CLINICAL DATA:  Shortness of breath COVID positive EXAM: CT ANGIOGRAPHY CHEST WITH CONTRAST TECHNIQUE: Multidetector CT imaging of the chest was performed using the standard protocol during bolus administration of intravenous contrast. Multiplanar CT image reconstructions and MIPs were obtained to evaluate the vascular anatomy. CONTRAST:  64mL OMNIPAQUE IOHEXOL 350 MG/ML SOLN COMPARISON:  None. FINDINGS:  Cardiovascular: There is slightly suboptimal opacification of the main pulmonary artery. No central or proximal segmental pulmonary embolism. The heart is normal in size. No pericardial effusion or thickening. No evidence right heart strain. There is normal three-vessel brachiocephalic anatomy without proximal stenosis. The thoracic aorta is normal in appearance. Mediastinum/Nodes: No hilar, mediastinal, or axillary adenopathy. There is a heterogeneously hypodense lesion seen within the right thyroid lobe measuring 4.3 x 3.1 cm. Lungs/Pleura: Extensive multifocal patchy airspace opacities are seen throughout both lungs. There are air bronchogram seen at both lung bases. No pleural effusion or pneumothorax. Upper Abdomen: No acute abnormalities present in the visualized portions of the upper abdomen. Musculoskeletal: No chest wall abnormality. No acute or significant osseous findings. Review of the MIP images confirms the above findings. IMPRESSION: Slightly suboptimal opacification of the main pulmonary artery, however no central or proximal segmental pulmonary embolism. Extensive multifocal airspace opacities, consistent with atypical viral pneumonia Heterogeneous right thyroid lobe lesion measuring 4.3 x 3.1 cm. Recommend thyroid US (ref: J Am Coll Radiol. 2015 Feb;12(2): 143-50). Electronically Signed   By: Jonna Clark M.D.   On: Nov 20, 2019 21:23   DG Chest Port 1 View  Result Date: 20-Nov-2019 CLINICAL DATA:  Possible COVID exposure. EXAM: PORTABLE CHEST 1 VIEW COMPARISON:  Chest radiograph December 07, 2018. FINDINGS: Exam limited secondary to technique and exposure. Stable cardiac and mediastinal contours. Interval development of patchy bilateral airspace opacities. No pleural effusion or pneumothorax. IMPRESSION: Interval development of patchy bilateral airspace opacities which may represent pneumonia. Recommend repeat chest radiograph with PA and lateral chest radiograph when patient clinically able.  Electronically Signed   By: Annia Belt M.D.   On: 11-20-19 17:17    ASSESSMENT / PLAN:  Acute Hypoxic Respiratory Failure secondary to COVID Pneumonia - Supplemental O2 as needed to maintain O2 saturations 88 to 92% - High risk for intubation - Follow intermittent ABG and chest x-ray as needed -Trend procalcitonin, Inflammatory markers - Blood cultures pending - Continue Empiric abx with Ceftriaxone and Azithromycin - Continue Remdesivir - Continue Decadron 6 mg Q 24 hours - Inhalers with flutter valve - Daily CRP, CMP, CBC, D-dimer - Echocardiogram pending - Vitamins (Zinc and Vitamin C) - Scheduled and prn bronchodilator therapy  - Aggressive pulmonary hygiene   Uncontrolled Type II Diabetes mellitus Now with hyperglycemia likely due to steroids -CBGs -Sliding scale insulin -Follow ICU hyper/hypoglycemia protocol -Hold home Metformin, Jardiance and Glipizide -Will check hourly if still >400 will consider starting insulin gtt   Acute kidney Injury -Monitor I&O's / urinary output -Follow BMP -Ensure adequate renal perfusion -Avoid nephrotoxic agents as able -Replace electrolytes as indicated  Elevated LFTs - Check hepatitis panel if continues to trend up - Lipid profile, CK and TSH - Continue to trend LFTs  HTN  + Goal BP <130/80 - Continue Amlodipine - Hold ACE-Inhibitor in the setting of AKI  Hx of Pulmonary Embolism -Continue Coumadin    Webb Silversmith. DNP, CCRN, FNP-BC AGACNP [employer]   10/23/2019, 4:48 AM

## 2019-10-23 NOTE — ED Notes (Signed)
Stan Head, np in to see pt.

## 2019-10-23 NOTE — ED Notes (Signed)
Message sent to pharmacy for linagliptin

## 2019-10-23 NOTE — ED Notes (Signed)
RT in to perform abg

## 2019-10-23 NOTE — ED Notes (Signed)
Pt increased to 60lpm high flow with nrb mask at 15lpm over. Pt with pox up from 86% to 93% with oxygen modifications. Pt is able to speak in full sentences without difficulty. Watching tv.

## 2019-10-23 NOTE — ED Notes (Signed)
Second message sent to pharmacy for medication

## 2019-10-23 NOTE — Progress Notes (Addendum)
ANTICOAGULATION CONSULT NOTE - Initial Consult  Pharmacy Consult for Warfarin Dosing Indication: hx of  DVT  No Known Allergies  Patient Measurements: Height: 6\' 3"  (190.5 cm) Weight: (!) 149.7 kg (330 lb) IBW/kg (Calculated) : 84.5  Vital Signs: Temp: 99.4 F (37.4 C) (08/15 0420) Temp Source: Oral (08/15 0420) BP: 123/82 (08/15 0730) Pulse Rate: 104 (08/15 0730)  Labs: Recent Labs    10/21/2019 1655 10/23/19 0814  HGB 14.4  --   HCT 39.9  --   PLT 244  --   APTT  --  131*  LABPROT  --  40.3*  INR  --  4.3*  CREATININE 1.53*  --     Estimated Creatinine Clearance: 83.3 mL/min (A) (by C-G formula based on SCr of 1.53 mg/dL (H)).  Medical History: Past Medical History:  Diagnosis Date  . Diabetes mellitus without complication (HCC)    Medications:  Scheduled:  . albuterol  2 puff Inhalation Q6H  . amLODipine  10 mg Oral Daily  . vitamin C  500 mg Oral Daily  . atorvastatin  80 mg Oral Daily  . baricitinib  4 mg Oral Daily  . colchicine  0.6 mg Oral Daily  . dexamethasone (DECADRON) injection  6 mg Intravenous Q24H  . insulin aspart  0-20 Units Subcutaneous Q4H  . Ipratropium-Albuterol  1 puff Inhalation Q6H  . linagliptin  5 mg Oral Daily  . lisinopril  40 mg Oral Daily  . multivitamin with minerals  1 tablet Oral Daily  . thiamine injection  100 mg Intravenous Daily  . zinc sulfate  220 mg Oral Daily    Assessment: 59 year old male with history of DM, HTN, and DVT. Patient is COVID +. Patient reports taking 11 mg daily. Med rec tech is attempting to confirm with pt's wife.  Goal of Therapy:  INR 2-3 Monitor platelets by anticoagulation protocol: Yes   Date INR Dose  PTA ---- 11 mg  8/15 4.3 Hold       Plan:  Hold warfarin dose for today Recheck INR with AM labs  9/15, PharmD Pharmacy Resident  10/23/2019 9:54 AM

## 2019-10-23 NOTE — ED Notes (Signed)
Pt switched to hospital bed

## 2019-10-23 NOTE — Progress Notes (Signed)
ANTICOAGULATION CONSULT NOTE - Initial Consult  Pharmacy Consult for heparin Indication: pulmonary embolus  No Known Allergies  Patient Measurements: Height: 6\' 3"  (190.5 cm) Weight: (!) 149.7 kg (330 lb) IBW/kg (Calculated) : 84.5  Vital Signs: Temp: 99.4 F (37.4 C) (08/15 0420) Temp Source: Oral (08/15 0420) BP: 122/90 (08/15 0600) Pulse Rate: 100 (08/15 0640)  Labs: Recent Labs    10/13/2019 1655  HGB 14.4  HCT 39.9  PLT 244  CREATININE 1.53*    Estimated Creatinine Clearance: 83.3 mL/min (A) (by C-G formula based on SCr of 1.53 mg/dL (H)).   Medical History: Past Medical History:  Diagnosis Date  . Diabetes mellitus without complication (HCC)     Medications:  Scheduled:  . albuterol  2 puff Inhalation Q6H  . amLODipine  10 mg Oral Daily  . ascorbic acid  500 mg Intravenous Daily  . atorvastatin  80 mg Oral Daily  . baricitinib  4 mg Oral Daily  . colchicine  0.6 mg Oral Daily  . dexamethasone (DECADRON) injection  6 mg Intravenous Q24H  . insulin aspart  0-20 Units Subcutaneous Q4H  . Ipratropium-Albuterol  1 puff Inhalation Q6H  . linagliptin  5 mg Oral Daily  . lisinopril  40 mg Oral Daily  . multivitamin with minerals  1 tablet Oral Daily  . thiamine injection  100 mg Intravenous Daily  . zinc sulfate  220 mg Oral Daily    Assessment: Patient admitted x SOB and weakness w/ h/o DVT/PE on warfarin PTA; however, med list not updated, per current list states patient takes 11 mg of warfarin daily? Needs verification, patient does have large body habitus, INR pending, CBC WNL. Patient is being restarted on his warfarin.  Goal of Therapy:  INR 2-3 Monitor platelets by anticoagulation protocol: Yes   Plan:  Waiting on INR to assess, patient is currently on VTE prophylaxis lovenox.  10/24/19, PharmD, BCPS Clinical Pharmacist 10/23/2019,7:00 AM

## 2019-10-23 NOTE — ED Notes (Signed)
Report to ariel, rn.  

## 2019-10-23 NOTE — ED Notes (Signed)
Message maggie tukov, np regarding 425 blood sugar and order.

## 2019-10-23 NOTE — ED Notes (Signed)
Pt ringing call bell approx every 20 minutes for small requests. Additional ice chips provided. Pt asking for mirror. Pt tolerating oxygen well, nrb with high flow cannula at 60lpm

## 2019-10-23 NOTE — ED Notes (Signed)
Pt called this RN requests NRB for SOB, pt attempting to lay flat

## 2019-10-24 LAB — GLUCOSE, CAPILLARY
Glucose-Capillary: 193 mg/dL — ABNORMAL HIGH (ref 70–99)
Glucose-Capillary: 254 mg/dL — ABNORMAL HIGH (ref 70–99)
Glucose-Capillary: 266 mg/dL — ABNORMAL HIGH (ref 70–99)
Glucose-Capillary: 279 mg/dL — ABNORMAL HIGH (ref 70–99)
Glucose-Capillary: 297 mg/dL — ABNORMAL HIGH (ref 70–99)
Glucose-Capillary: 304 mg/dL — ABNORMAL HIGH (ref 70–99)

## 2019-10-24 LAB — CBC WITH DIFFERENTIAL/PLATELET
Abs Immature Granulocytes: 0.14 10*3/uL — ABNORMAL HIGH (ref 0.00–0.07)
Basophils Absolute: 0 10*3/uL (ref 0.0–0.1)
Basophils Relative: 0 %
Eosinophils Absolute: 0 10*3/uL (ref 0.0–0.5)
Eosinophils Relative: 0 %
HCT: 36.5 % — ABNORMAL LOW (ref 39.0–52.0)
Hemoglobin: 13 g/dL (ref 13.0–17.0)
Immature Granulocytes: 1 %
Lymphocytes Relative: 6 %
Lymphs Abs: 0.8 10*3/uL (ref 0.7–4.0)
MCH: 26.8 pg (ref 26.0–34.0)
MCHC: 35.6 g/dL (ref 30.0–36.0)
MCV: 75.3 fL — ABNORMAL LOW (ref 80.0–100.0)
Monocytes Absolute: 0.5 10*3/uL (ref 0.1–1.0)
Monocytes Relative: 4 %
Neutro Abs: 12.4 10*3/uL — ABNORMAL HIGH (ref 1.7–7.7)
Neutrophils Relative %: 89 %
Platelets: 243 10*3/uL (ref 150–400)
RBC: 4.85 MIL/uL (ref 4.22–5.81)
RDW: 14.7 % (ref 11.5–15.5)
WBC: 13.8 10*3/uL — ABNORMAL HIGH (ref 4.0–10.5)
nRBC: 0.2 % (ref 0.0–0.2)

## 2019-10-24 LAB — PROCALCITONIN: Procalcitonin: 0.92 ng/mL

## 2019-10-24 LAB — COMPREHENSIVE METABOLIC PANEL
ALT: 120 U/L — ABNORMAL HIGH (ref 0–44)
AST: 225 U/L — ABNORMAL HIGH (ref 15–41)
Albumin: 3 g/dL — ABNORMAL LOW (ref 3.5–5.0)
Alkaline Phosphatase: 75 U/L (ref 38–126)
Anion gap: 13 (ref 5–15)
BUN: 26 mg/dL — ABNORMAL HIGH (ref 6–20)
CO2: 22 mmol/L (ref 22–32)
Calcium: 7.9 mg/dL — ABNORMAL LOW (ref 8.9–10.3)
Chloride: 102 mmol/L (ref 98–111)
Creatinine, Ser: 0.91 mg/dL (ref 0.61–1.24)
GFR calc Af Amer: >60 mL/min (ref >60)
GFR calc non Af Amer: >60 mL/min (ref >60)
Glucose, Bld: 231 mg/dL — ABNORMAL HIGH (ref 70–99)
Potassium: 4.1 mmol/L (ref 3.5–5.1)
Sodium: 137 mmol/L (ref 135–145)
Total Bilirubin: 0.7 mg/dL (ref 0.3–1.2)
Total Protein: 7 g/dL (ref 6.5–8.1)

## 2019-10-24 LAB — BLOOD GAS, ARTERIAL
Acid-Base Excess: 2.5 mmol/L — ABNORMAL HIGH (ref 0.0–2.0)
Bicarbonate: 26.3 mmol/L (ref 20.0–28.0)
FIO2: 1
O2 Saturation: 90 %
Patient temperature: 37
pCO2 arterial: 37 mmHg (ref 32.0–48.0)
pH, Arterial: 7.46 — ABNORMAL HIGH (ref 7.350–7.450)
pO2, Arterial: 55 mmHg — ABNORMAL LOW (ref 83.0–108.0)

## 2019-10-24 LAB — PROTIME-INR
INR: 4.6 (ref 0.8–1.2)
Prothrombin Time: 42.4 seconds — ABNORMAL HIGH (ref 11.4–15.2)

## 2019-10-24 LAB — MAGNESIUM: Magnesium: 2.7 mg/dL — ABNORMAL HIGH (ref 1.7–2.4)

## 2019-10-24 LAB — MRSA PCR SCREENING: MRSA by PCR: NEGATIVE

## 2019-10-24 LAB — FERRITIN: Ferritin: 2245 ng/mL — ABNORMAL HIGH (ref 24–336)

## 2019-10-24 LAB — C-REACTIVE PROTEIN: CRP: 22 mg/dL — ABNORMAL HIGH (ref <1.0)

## 2019-10-24 LAB — HEPATITIS B SURFACE ANTIGEN: Hepatitis B Surface Ag: NONREACTIVE

## 2019-10-24 LAB — HEPATITIS C ANTIBODY: HCV Ab: NONREACTIVE

## 2019-10-24 LAB — PHOSPHORUS: Phosphorus: 3 mg/dL (ref 2.5–4.6)

## 2019-10-24 LAB — FIBRIN DERIVATIVES D-DIMER (ARMC ONLY): Fibrin derivatives D-dimer (ARMC): >7500 ng/mL (FEU) — ABNORMAL HIGH (ref 0.00–499.00)

## 2019-10-24 MED ORDER — BARICITINIB 2 MG PO TABS
4.0000 mg | ORAL_TABLET | Freq: Every day | ORAL | Status: DC
  Administered 2019-10-24: 4 mg via ORAL
  Filled 2019-10-24 (×4): qty 2

## 2019-10-24 MED ORDER — ALBUTEROL SULFATE (2.5 MG/3ML) 0.083% IN NEBU: 2.5000 mg | INHALATION_SOLUTION | RESPIRATORY_TRACT | Status: DC

## 2019-10-24 MED ORDER — FUROSEMIDE 10 MG/ML IJ SOLN
40.0000 mg | Freq: Once | INTRAMUSCULAR | Status: AC
  Administered 2019-10-24: 40 mg via INTRAVENOUS
  Filled 2019-10-24: qty 4

## 2019-10-24 MED ORDER — LORAZEPAM 2 MG/ML IJ SOLN
0.5000 mg | Freq: Four times a day (QID) | INTRAMUSCULAR | Status: DC | PRN
  Administered 2019-10-24 – 2019-10-25 (×5): 0.5 mg via INTRAVENOUS
  Filled 2019-10-24 (×6): qty 1

## 2019-10-24 MED ORDER — ALBUTEROL SULFATE HFA 108 (90 BASE) MCG/ACT IN AERS
2.0000 | INHALATION_SPRAY | RESPIRATORY_TRACT | Status: DC
  Administered 2019-10-24: 2 via RESPIRATORY_TRACT
  Filled 2019-10-24: qty 6.7

## 2019-10-24 MED ORDER — LISINOPRIL 20 MG PO TABS
40.0000 mg | ORAL_TABLET | Freq: Every day | ORAL | Status: DC
  Filled 2019-10-24 (×2): qty 2

## 2019-10-24 NOTE — ED Notes (Signed)
This RN and Jaclynn Guarneri, RN to bedside. Pt noted to not be in bed, pt visualized stumbling back to bed, pt noted to have removed all monitoring equipment including Bipap at this time. Initial RA sats 48%. This RN, Jaclynn Guarneri, RN and Marthann Schiller, RN placed patient back on Bipap and all monitoring equipment back on patient at this time. This RN and Jaclynn Guarneri, RN attempted to place foley catheter without success.

## 2019-10-24 NOTE — Progress Notes (Addendum)
PROGRESS NOTE    Spencer Woodward  NFA:[id number] DOB: 01/15/1961 DOA: Jul 24, 2019 PCP: Spencer Woodward, Spencer Mancheno, MD   Brief Narrative:  Spencer Woodward is a 59 y.o. male with medical history significant for DM, HTN, PE on Coumadin and obesity, who presents to the emergency room with a 2-day history of generalized malaise, shortness of breath, with chest discomfort on deep inspiration.  No cough or fever, only chills.  Positive Covid 19 contact.  Patient is unvaccinated.  Covid PCR positive with elevated inflammatory markers.  Hypoxic requiring heated HFNC at 60 L with FiO2 of 100%.CTA with slightly suboptimal opacification of main pulmonary artery but no central or proximal segmental PE, bilateral groundglass opacities consistent with COVID-19 pneumonia, INR supratherapeutic at 4.3 as patient was on Coumadin due to his history of prior PE.  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 He was started on remdesivir, Decadron and baricitinib.  Pulmonary was consulted.  Subjective: Patient was desaturating in 70s on maximum setting of heated HFNC.  Able to speak in full sentences although feeling short of breath.  Assessment & Plan:   Principal Problem:   Acute respiratory failure due to COVID-19 [facility](HCC) Active Problems:   HTN (hypertension)   Diabetes mellitus without complication (HCC)   Obesity, Class III, BMI 40-49.9 (morbid obesity) (HCC)   Pneumonia due to COVID-19 virus   History of DVT (deep vein thrombosis)  Acute hypoxic respiratory failure secondary to COVID-19 pneumonia. Elevated inflammatory markers, procalcitonin at 1.51>>0.92  Patient was started on ceftriaxone and Zithromax for superadded bacterial infection. MRSA PCR screening negative.  Worsening inflammatory markers. ABG checked and it shows worsening hypoxia. -Start him on BiPAP due to persistent hypoxia despite staying on maximum setting of heated HFNC. -Pulmonary consult-patient is high risk for intubation and deterioration. -Continue remdesivir  and Decadron-day 3 -Continue baricitinib. -Continue ceftriaxone and Zithromax. -Continue to monitor inflammatory markers. -Continue supportive care. -Continue with supplemental oxygen with heated HFNC and BiPAP.  Worsening liver function.  Patient with elevated AST, ALT and INR.  Acute worsening after starting remdesivir and baricitinib. Discussed with pharmacy and decided to continue with treatment and closely monitor his liver functions as he is still in gray zone. 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  Repeat CTA with some opacification of mid pulmonary artery, may be chronic.  Patient has supratherapeutic INR today at 4.6.  No sign of bleeding.  Worsening INR can be due to liver dysfunction. 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 -Pharmacy is managing her Coumadin.  Hypertension.  Blood pressure elevated now.  Initially his home dose of antihypertensives were held due to softer blood pressure.  It remained elevated despite restarting home dose of amlodipine. -Continue amlodipine -Restart home dose of lisinopril.  Type 2 diabetes mellitus.  CBG elevated. -Restart home Levemir. -Continue with sliding scale.  Morbid obesity. Body mass index is 41.25 kg/m.  This will complicate overall prognosis.   Objective: Vitals:   10/24/19 0800 10/24/19 0830 10/24/19 1000 10/24/19 1230  BP: (!) 141/83 (!) 152/90 (!) 146/94 137/80  Pulse: (!) 108 (!) 111 (!) 103 (!) 113  Resp: (!) 30 (!) 36 (!) 28 19  Temp:      TempSrc:      SpO2: (!) 75% (!) 73% 95% (!) 79%  Weight:      Height:        Intake/Output Summary (Last 24  hours) at 10/24/2019 1330 Last data filed at 10/24/2019 1014 Gross per 24 hour  Intake 775 ml  Output 1475 ml  Net -700 ml   Filed Weights   2019-10-31 1658  Weight: (!) 149.7 kg    Examination:  General.  Obese gentleman, appears anxious and  tachypneic. Pulmonary.  Lungs clear bilaterally, tachypneic. CV.  Regular rate and rhythm, no JVD, rub or murmur. Abdomen.  Soft, nontender, nondistended, BS positive. CNS.  Alert and oriented x3.  No focal neurologic deficit. Extremities.  No edema, no cyanosis, pulses intact and symmetrical. Psychiatry.  Judgment and insight appears normal.  DVT prophylaxis: Coumadin Code Status: Full Family Communication: Discussed with patient.  Called wife with no response. Disposition Plan:  Status is: Inpatient  Remains inpatient appropriate because:Inpatient level of care appropriate due to severity of illness   Dispo: The patient is from: Home              Anticipated d/c is to: Home              Anticipated d/c date is: 3 days              Patient currently is not medically stable to d/c.  Consultants:   Pulmonary  Procedures:  Antimicrobials:  Ceftriaxone Zithromax  Data Reviewed: I have personally reviewed following labs and imaging studies  CBC: Recent Labs  Lab 10-31-19 1655 10/24/19 0446  WBC 10.8* 13.8*  NEUTROABS 9.9* 12.4*  HGB 14.4 13.0  HCT 39.9 36.5*  MCV 74.7* 75.3*  PLT 244 243   Basic Metabolic Panel: Recent Labs  Lab 31-Oct-2019 1655 10/24/19 0446  NA 134* 137  K 4.3 4.1  CL 98 102  CO2 23 22  GLUCOSE 408* 231*  BUN 26* 26*  CREATININE 1.53* 0.91  CALCIUM 7.8* 7.9*  MG  --  2.7*  PHOS  --  3.0   GFR: Estimated Creatinine Clearance: 140.1 mL/min (by C-G formula based on SCr of 0.91 mg/dL). Liver Function Tests: Recent Labs  Lab 10/31/19 1655 10/24/19 0446  AST 82* 225*  ALT 55* 120*  ALKPHOS 59 75  BILITOT 0.9 0.7  PROT 7.9 7.0  ALBUMIN 3.4* 3.0*   No results for input(s): LIPASE, AMYLASE in the last 168 hours. No results for input(s): AMMONIA in the last 168 hours. Coagulation Profile: Recent Labs  Lab 10/23/19 0814 10/24/19 0446  INR 4.3* 4.6*   Cardiac Enzymes: No results for input(s): CKTOTAL, CKMB, CKMBINDEX, TROPONINI in  the last 168 hours. BNP (last 3 results) No results for input(s): PROBNP in the last 8760 hours. HbA1C: Recent Labs    10/23/19 0814  HGBA1C 9.4*   CBG: Recent Labs  Lab 10/23/19 1714 10/23/19 2140 10/24/19 0458 10/24/19 0744 10/24/19 1256  GLUCAP 348* 265* 193* 279* 266*   Lipid Profile: Recent Labs    10-31-19 1655  TRIG 144   Thyroid Function Tests: No results for input(s): TSH, T4TOTAL, FREET4, T3FREE, THYROIDAB in the last 72 hours. Anemia Panel: Recent Labs    31-Oct-2019 1655 10/24/19 0446  FERRITIN 1,481* 2,245*   Sepsis Labs: Recent Labs  Lab 10-31-19 1655 10-31-19 1703 October 31, 2019 1928 10/23/19 0708 10/24/19 0446  PROCALCITON 1.51  --   --  1.42 0.92  LATICACIDVEN  --  1.8 1.4  --   --     Recent Results (from the past 240 hour(s))  Blood Culture (routine x 2)     Status: None (Preliminary result)   Collection Time: 31-Oct-2019  4:55 PM   Specimen: BLOOD  Result Value Ref Range Status   Specimen Description BLOOD BLOOD LEFT FOREARM  Final   Special Requests   Final    BOTTLES DRAWN AEROBIC AND ANAEROBIC Blood Culture results may not be optimal due to an excessive volume of blood received in culture bottles   Culture   Final    NO GROWTH 2 DAYS Performed at [facility], [address] [place], [place] [address]    Report Status PENDING  Incomplete  SARS Coronavirus 2 by RT PCR (hospital order, performed in [facility] hospital lab) Nasopharyngeal Nasopharyngeal Swab     Status: Abnormal   Collection Time: 11-01-19  5:03 PM   Specimen: Nasopharyngeal Swab  Result Value Ref Range Status   SARS Coronavirus 2 POSITIVE (A) NEGATIVE Final    Comment: RESULT CALLED TO, READ BACK BY AND VERIFIED WITH: APRIL BRUMGARD 2019/11/01 AT 2027 HS (NOTE) SARS-CoV-2 target nucleic acids are DETECTED  SARS-CoV-2 RNA is generally detectable in upper respiratory specimens  during the acute phase of infection.  Positive results are indicative  of the presence  of the identified virus, but do not rule out bacterial infection or co-infection with other pathogens not detected by the test.  Clinical correlation with patient history and  other diagnostic information is necessary to determine patient infection status.  The expected result is negative.  Fact Sheet for Patients:   BoilerBrush.com.cy   Fact Sheet for Healthcare Providers:   https://pope.com/    This test is not yet approved or cleared by the [place] FDA and  has been authorized for detection and/or diagnosis of SARS-CoV-2 by FDA under an Emergency Use Authorization (EUA).  This EUA will remain in effect (meaning this tes t can be used) for the duration of  the COVID-19 declaration under Section 564(b)(1) of the Act, 21 U.S.C. section 360-bbb-3(b)(1), unless the authorization is terminated or revoked sooner.  Performed at [facility], [address] [place], [place] [address]   Blood Culture (routine x 2)     Status: None (Preliminary result)   Collection Time: Nov 01, 2019  6:01 PM   Specimen: BLOOD  Result Value Ref Range Status   Specimen Description BLOOD RH  Final   Special Requests   Final    BOTTLES DRAWN AEROBIC AND ANAEROBIC Blood Culture adequate volume   Culture   Final    NO GROWTH 2 DAYS Performed at [facility], [address] [place], [place] [address]    Report Status PENDING  Incomplete  MRSA PCR Screening     Status: None   Collection Time: 10/23/19  7:43 AM   Specimen: Nasal Mucosa; Nasopharyngeal  Result Value Ref Range Status   MRSA by PCR NEGATIVE NEGATIVE Final    Comment:        The GeneXpert MRSA Assay (FDA approved for NASAL specimens only), is one component of a comprehensive MRSA colonization surveillance program. It is not intended to diagnose MRSA infection nor to guide or monitor treatment for MRSA infections. Performed at [facility], [address] [place], [place] [address]      Radiology Studies: CT Angio Chest PE W and/or Wo Contrast  Result Date: November 01, 2019 CLINICAL DATA:  Shortness of breath COVID positive EXAM: CT ANGIOGRAPHY CHEST WITH CONTRAST TECHNIQUE: Multidetector CT imaging of the chest was performed using the standard protocol during bolus administration of intravenous contrast. Multiplanar CT image reconstructions and MIPs were obtained to evaluate the vascular  anatomy. CONTRAST:  53mL OMNIPAQUE IOHEXOL 350 MG/ML SOLN COMPARISON:  None. FINDINGS: Cardiovascular: There is slightly suboptimal opacification of the main pulmonary artery. No central or proximal segmental pulmonary embolism. The heart is normal in size. No pericardial effusion or thickening. No evidence right heart strain. There is normal three-vessel brachiocephalic anatomy without proximal stenosis. The thoracic aorta is normal in appearance. Mediastinum/Nodes: No hilar, mediastinal, or axillary adenopathy. There is a heterogeneously hypodense lesion seen within the right thyroid lobe measuring 4.3 x 3.1 cm. Lungs/Pleura: Extensive multifocal patchy airspace opacities are seen throughout both lungs. There are air bronchogram seen at both lung bases. No pleural effusion or pneumothorax. Upper Abdomen: No acute abnormalities present in the visualized portions of the upper abdomen. Musculoskeletal: No chest wall abnormality. No acute or significant osseous findings. Review of the MIP images confirms the above findings. IMPRESSION: Slightly suboptimal opacification of the main pulmonary artery, however no central or proximal segmental pulmonary embolism. Extensive multifocal airspace opacities, consistent with atypical viral pneumonia Heterogeneous right thyroid lobe lesion measuring 4.3 x 3.1 cm. Recommend thyroid US (ref: J Am Coll Radiol. 2015 Feb;12(2): 143-50). Electronically Signed   By: Jonna Clark M.D.   On: 10-31-19 21:23   DG Chest Port 1 View  Result Date:  Oct 31, 2019 CLINICAL DATA:  Possible COVID exposure. EXAM: PORTABLE CHEST 1 VIEW COMPARISON:  Chest radiograph December 07, 2018. FINDINGS: Exam limited secondary to technique and exposure. Stable cardiac and mediastinal contours. Interval development of patchy bilateral airspace opacities. No pleural effusion or pneumothorax. IMPRESSION: Interval development of patchy bilateral airspace opacities which may represent pneumonia. Recommend repeat chest radiograph with PA and lateral chest radiograph when patient clinically able. Electronically Signed   By: Annia Belt M.D.   On: 10/31/2019 17:17    Scheduled Meds: . albuterol  2 puff Inhalation Q6H  . albuterol  2 puff Inhalation Q4H  . amLODipine  10 mg Oral Daily  . vitamin C  500 mg Oral Daily  . atorvastatin  80 mg Oral Daily  . baricitinib  4 mg Oral Daily  . colchicine  0.6 mg Oral Daily  . dexamethasone (DECADRON) injection  6 mg Intravenous Q24H  . insulin aspart  0-20 Units Subcutaneous Q4H  . insulin glargine  60 Units Subcutaneous Daily  . Ipratropium-Albuterol  1 puff Inhalation Q6H  . linagliptin  5 mg Oral Daily  . multivitamin with minerals  1 tablet Oral Daily  . thiamine injection  100 mg Intravenous Daily  . Warfarin - Pharmacist Dosing Inpatient   Does not apply q1600  . zinc sulfate  220 mg Oral Daily   Continuous Infusions: . azithromycin Stopped (10/23/19 1858)  . cefTRIAXone (ROCEPHIN)  IV Stopped (10/23/19 1757)  . remdesivir 100 mg in NS 100 mL Stopped (10/24/19 1014)     LOS: 2 days   Time spent: 45 minutes.  Arnetha Courser, MD [facility]  If 7PM-7AM, please contact night-coverage [email or web address]  10/24/2019, 1:30 PM   This record has been created using Conservation officer, historic buildings. Errors have been sought and corrected,but may not always be located. Such creation errors do not reflect on the standard of care.

## 2019-10-24 NOTE — Progress Notes (Signed)
ANTICOAGULATION CONSULT NOTE  Pharmacy Consult for Warfarin Dosing Indication: hx of  DVT  No Known Allergies  Patient Measurements: Height: 6\' 3"  (190.5 cm) Weight: (!) 149.7 kg (330 lb) IBW/kg (Calculated) : 84.5  Vital Signs: BP: 146/94 (08/16 1000) Pulse Rate: 103 (08/16 1000)  Labs: Recent Labs    11/21/2019 1655 10/23/19 0814 10/24/19 0446  HGB 14.4  --  13.0  HCT 39.9  --  36.5*  PLT 244  --  243  APTT  --  131*  --   LABPROT  --  40.3* 42.4*  INR  --  4.3* 4.6*  CREATININE 1.53*  --  0.91    Estimated Creatinine Clearance: 140.1 mL/min (by C-G formula based on SCr of 0.91 mg/dL).  Medical History: Past Medical History:  Diagnosis Date   Diabetes mellitus without complication (HCC)    Medications:  Scheduled:   albuterol  2 puff Inhalation Q6H   albuterol  2 puff Inhalation Q4H   amLODipine  10 mg Oral Daily   vitamin C  500 mg Oral Daily   atorvastatin  80 mg Oral Daily   baricitinib  4 mg Oral Daily   colchicine  0.6 mg Oral Daily   dexamethasone (DECADRON) injection  6 mg Intravenous Q24H   insulin aspart  0-20 Units Subcutaneous Q4H   insulin glargine  60 Units Subcutaneous Daily   Ipratropium-Albuterol  1 puff Inhalation Q6H   linagliptin  5 mg Oral Daily   multivitamin with minerals  1 tablet Oral Daily   thiamine injection  100 mg Intravenous Daily   Warfarin - Pharmacist Dosing Inpatient   Does not apply q1600   zinc sulfate  220 mg Oral Daily    Assessment: 59 year old male with history of DM, HTN, and DVT. Patient is COVID +. Patient reports taking 11 mg daily.  Goal of Therapy:  INR 2-3 Monitor platelets by anticoagulation protocol: Yes   Date INR Dose  PTA ---- 11 mg  8/15 4.3 Hold  8/16 4.6 Hold   Suspect INR increase, correlated with worsening transaminitis, is resultant of liver dysfunction.    Plan:  Hold warfarin dose for today Recheck INR with AM labs  9/15 10/24/2019 10:39 AM

## 2019-10-24 NOTE — ED Notes (Signed)
Dr. Jayme Cloud at bedside at this time.

## 2019-10-24 NOTE — ED Notes (Signed)
Pt given whole milk and ice water

## 2019-10-24 NOTE — ED Notes (Signed)
RT notified that per Dr. Nelson Chimes, pt to be placed on Bipap

## 2019-10-24 NOTE — ED Notes (Signed)
Pt's wife updated regarding patient care. Admitting MD updated regarding patient increasing anxiety and agitation, this RN offered to speak with admitting MD regarding possible anxiety medication, pt refusing. Pt repeatedly asking his wife to come get him, this RN spoke with patient's wife and highly advised patient's wife that patient should not leave.

## 2019-10-24 NOTE — ED Notes (Signed)
Admitting MD and RT made aware that patient requesting a break from BiPap.

## 2019-10-24 NOTE — ED Notes (Addendum)
Admitting MD made aware that patient's sats now mid 70's while on NRB and Hi-flow [place], pt continues to be A&O x4 and able to speak in full sentences at this time. Pt encouraged to use incentive spirometer by Jaclynn Guarneri, Charity fundraiser.

## 2019-10-24 NOTE — ED Notes (Signed)
Trash removed, pt given more ice water and bedside urinal emptied, pt has tried to lay on side to pronate  Pt back on NRB and HFNC

## 2019-10-24 NOTE — Progress Notes (Signed)
Name: Spencer Woodward MRN: [id number] DOB: December 17, 1960    ADMISSION DATE:  11/11/19   INITIAL CONSULTATION DATE: 10/23/2019  REFERRING MD: Lindajo Royal, MD    CHIEF COMPLAINT: Shortness of breath  BRIEF PATIENT DESCRIPTION: 59 year old male with PMH significant for uncontrolled type 2 DM, HLD, HTN, Thyroid nodule, gout, morbid obesity and pulmonary embolism presented to [facility] ER on 08/14 via EMS from home with generalized malaise, shortness of breath and chest discomfort with inspiration.  Admitted with acute hypoxic respiratory failure secondary to COVID-19 Pneumonia.  On high flow O2 alternating with BiPAP  SIGNIFICANT EVENTS  08/14: Pt admitted to stepdown unit with COVID pneumonia 08/16: Remains housed in EDP due to no rooms.  Still stepdown status, remains BiPAP dependent  STUDIES:  8/14:Chest XrayshowsInterval development of patchy bilateral airspace opacities which may represent pneumonia 8/14: CTA Chest Slightly suboptimal opacification of the main pulmonary artery, however no central or proximal segmental pulmonary embolism.Extensive multifocal airspace opacities, consistent with atypical viral pneumonia  CULTURES: 8/14:BCxpending  ANTIBIOTICS: Ceftriaxone 8/14> Azithromycin 8/14>    No Known Allergies  Scheduled Meds: . albuterol  2 puff Inhalation Q6H  . amLODipine  10 mg Oral Daily  . vitamin C  500 mg Oral Daily  . atorvastatin  80 mg Oral Daily  . baricitinib  4 mg Oral Daily  . colchicine  0.6 mg Oral Daily  . dexamethasone (DECADRON) injection  6 mg Intravenous Q24H  . insulin aspart  0-20 Units Subcutaneous Q4H  . insulin glargine  60 Units Subcutaneous Daily  . Ipratropium-Albuterol  1 puff Inhalation Q6H  . linagliptin  5 mg Oral Daily  . lisinopril  40 mg Oral Daily  . multivitamin with minerals  1 tablet Oral Daily  . thiamine injection  100 mg Intravenous Daily  . Warfarin - Pharmacist Dosing Inpatient   Does not apply q1600  . zinc sulfate   220 mg Oral Daily   Continuous Infusions: . azithromycin Stopped (10/23/19 1858)  . cefTRIAXone (ROCEPHIN)  IV Stopped (10/23/19 1757)  . remdesivir 100 mg in NS 100 mL Stopped (10/24/19 1014)   PRN Meds:.acetaminophen, chlorpheniramine-HYDROcodone, guaiFENesin-dextromethorphan, LORazepam, ondansetron **OR** ondansetron (ZOFRAN) IV   REVIEW OF SYSTEMS:   Constitutional: Negative for fever, chills, weight loss, malaise/fatigue and diaphoresis.  HENT: Negative for hearing loss, ear pain, nosebleeds, congestion, sore throat, neck pain, tinnitus and ear discharge.   Eyes: Negative for blurred vision, double vision, photophobia, pain, discharge and redness.  Respiratory: Positive for cough, shortness of breath. Negative for hemoptysis, sputum production, wheezing and stridor.   Cardiovascular: positive for chest pain with insipiration. Negative for palpitations, orthopnea, claudication, leg swelling and PND.  Gastrointestinal: Negative for heartburn, nausea, vomiting, abdominal pain, diarrhea, constipation, blood in stool and melena.  Genitourinary: Negative for dysuria, urgency, frequency, hematuria and flank pain.  Positive for urinary retention Musculoskeletal: Negative for myalgias, back pain, joint pain and falls.  Skin: Negative for itching and rash.  Neurological: Negative for dizziness, tingling, tremors, sensory change, speech change, focal weakness, seizures, loss of consciousness, weakness and headaches.  Endo/Heme/Allergies: Negative for environmental allergies and polydipsia. Does not bruise/bleed easily.  VITAL SIGNS: Pulse Rate:  [98-120] 120 (08/16 1530) Resp:  [15-36] 35 (08/16 1530) BP: (102-159)/(64-124) 159/93 (08/16 1530) SpO2:  [69 %-95 %] 86 % (08/16 1530) FiO2 (%):  [100 %] 100 % (08/16 1130)  PHYSICAL EXAMINATION: Physical examination is limited due to need for PPE/CAPR GENERAL: 59 year-old obese patient lying in the bed somewhat impulsive.  Mildly  confused. EYES:  PERRL, no scleral icterus. Extraocular muscles intact.  HEENT: Head atraumatic, normocephalic. Oropharynx and nasopharynx clear.  NECK:  Supple, no jugular venous distention. No thyroid enlargement, no tenderness.  Thick neck. LUNGS: Decreased breath sounds bilaterally, no wheezing, rales,rhonchi or crepitation. No use of accessory muscles of respiration.  Examination limited due to CAPR CARDIOVASCULAR: S1, S2 normal. No murmurs, rubs, or gallops.  ABDOMEN: Soft, nontender, nondistended. Bowel sounds present. No organomegaly or mass.  EXTREMITIES: No pedal edema, cyanosis, or clubbing.  NEUROLOGIC: Grossly nonfocal, mildly confused, impulsive. PSYCHIATRIC: Impulsive.  SKIN: No obvious rash, lesion, or ulcer.   Recent Labs  Lab 10/21/2019 1655 10/24/19 0446  NA 134* 137  K 4.3 4.1  CL 98 102  CO2 23 22  BUN 26* 26*  CREATININE 1.53* 0.91  GLUCOSE 408* 231*   Recent Labs  Lab 10/28/2019 1655 10/24/19 0446  HGB 14.4 13.0  HCT 39.9 36.5*  WBC 10.8* 13.8*  PLT 244 243   CT Angio Chest PE W and/or Wo Contrast  Result Date: 10/28/2019 CLINICAL DATA:  Shortness of breath COVID positive EXAM: CT ANGIOGRAPHY CHEST WITH CONTRAST TECHNIQUE: Multidetector CT imaging of the chest was performed using the standard protocol during bolus administration of intravenous contrast. Multiplanar CT image reconstructions and MIPs were obtained to evaluate the vascular anatomy. CONTRAST:  43mL OMNIPAQUE IOHEXOL 350 MG/ML SOLN COMPARISON:  None. FINDINGS: Cardiovascular: There is slightly suboptimal opacification of the main pulmonary artery. No central or proximal segmental pulmonary embolism. The heart is normal in size. No pericardial effusion or thickening. No evidence right heart strain. There is normal three-vessel brachiocephalic anatomy without proximal stenosis. The thoracic aorta is normal in appearance. Mediastinum/Nodes: No hilar, mediastinal, or axillary adenopathy. There is a heterogeneously  hypodense lesion seen within the right thyroid lobe measuring 4.3 x 3.1 cm. Lungs/Pleura: Extensive multifocal patchy airspace opacities are seen throughout both lungs. There are air bronchogram seen at both lung bases. No pleural effusion or pneumothorax. Upper Abdomen: No acute abnormalities present in the visualized portions of the upper abdomen. Musculoskeletal: No chest wall abnormality. No acute or significant osseous findings. Review of the MIP images confirms the above findings. IMPRESSION: Slightly suboptimal opacification of the main pulmonary artery, however no central or proximal segmental pulmonary embolism. Extensive multifocal airspace opacities, consistent with atypical viral pneumonia Heterogeneous right thyroid lobe lesion measuring 4.3 x 3.1 cm. Recommend thyroid US (ref: J Am Coll Radiol. 2015 Feb;12(2): 143-50). Electronically Signed   By: Jonna Clark M.D.   On: 10/09/2019 21:23    ASSESSMENT / PLAN:  Acute Hypoxic Respiratory Failure secondary to COVID Pneumonia  Supplemental O2 as needed to maintain O2 saturations 88 to 92% currently on BiPAP  Remains high risk for intubation  Self prone as able  Follow intermittent ABG and chest x-ray as needed  Trend procalcitonin, Inflammatory markers  Blood cultures pending  Continue Empiric abx with Ceftriaxone and Azithromycin  Continue Remdesivir and baricitinib  Continue Decadron 6 mg Q 24 hours  Inhalers with flutter valve, incentive spirometry every hour  Daily CRP, CMP, CBC, D-dimer  Echocardiogram pending, not performed yet  Vitamins (Zinc and Vitamin C)  Scheduled and prn bronchodilator therapy   Aggressive pulmonary hygiene  Mild volume overload, Lasix x1   Uncontrolled Type II Diabetes mellitus Now with hyperglycemia likely due to steroids -CBGs -Sliding scale insulin -Follow ICU hyper/hypoglycemia protocol -Hold home Metformin, Jardiance and Glipizide -Better control today   Acute kidney  Injury -Monitor I&O's / urinary  output -Follow BMP -Ensure adequate renal perfusion -Avoid nephrotoxic agents as able -Replace electrolytes as indicated -Issues with urinary retention, Foley catheter  Elevated LFTs -Transaminitis likely due to COVID-19 - Lipid profile, CK and TSH - Continue to trend LFTs  HTN  -Goal BP <130/80 -Continue Amlodipine -Discontinue ACE-Inhibitor in the setting of AKI and COVID-19  Hx of Pulmonary Embolism -Continue Coumadin    C. Danice Goltz, MD [facility]  10/24/2019, 5:13 PM  *This note was dictated using voice recognition software/Dragon.  Despite best efforts to proofread, errors can occur which can change the meaning.  Any change was purely unintentional.

## 2019-10-24 NOTE — Progress Notes (Signed)
Inpatient Diabetes Program Recommendations  AACE/ADA: New Consensus Statement on Inpatient Glycemic Control (2015)  Target Ranges:  Prepandial:   less than 140 mg/dL      Peak postprandial:   less than 180 mg/dL (1-2 hours)      Critically ill patients:  140 - 180 mg/dL   Results for Spencer Woodward, Spencer Woodward (MRN [id number]) as of 10/24/2019 07:32  Ref. Range 10/23/2019 04:53 10/23/2019 08:37 10/23/2019 13:16 10/23/2019 17:14 10/23/2019 21:40  Glucose-Capillary Latest Ref Range: 70 - 99 mg/dL 329 (H)  23 units NOVOLOG given at 6:03am 377 (H)  20 units NOVOLOG  391 (H)  20 units NOVOLOG +  60 units LANTUS  348 (H)  15 units NOVOLOG  265 (H)  11 units NOVOLOG +  60 units LANTUS    Results for Spencer Woodward, Spencer Woodward (MRN [id number]) as of 10/24/2019 08:07  Ref. Range 10/24/2019 04:58 10/24/2019 07:44  Glucose-Capillary Latest Ref Range: 70 - 99 mg/dL 630 (H)  4 units NOVOLOG  279 (H)   Results for Spencer Woodward, Spencer Woodward (MRN [id number]) as of 10/24/2019 07:32  Ref. Range 10/23/2019 08:14  Hemoglobin A1C Latest Ref Range: 4.8 - 5.6 % 9.4 (H)  (223 mg/dl)    Admit with: Acute hypoxic respiratory failure secondary to COVID-19 pneumonia  History: DM  Home DM Meds: Jardiance 25 mg Daily       Glipizide 10 mg Daily       Soliqua (Insulin glargine + Lixisenatide) 60 units Daily       Metformin 1000 mg BID  Current Orders: Lantus 60 units Daily      Novolog Resistant Correction Scale/ SSI (0-20 units) Q4 hours      Tradjenta 5 mg Daily     MD- Note patient getting Decadron 6 mg Daily.  Of note, patient received 2 doses of Lantus yesterday (60 units at 1pm and another 60 units at 10pm)  CBG still elevated this AM to 279.  Please consider:  1. Increase Lantus to 75 units Daily (25% increase)  2. Start Novolog Meal Coverage: Novolog 6 units TID with meals  (Please add the following Hold Parameters: Hold if pt eats <50% of meal, Hold if pt NPO)     --Will follow patient during  hospitalization--  Ambrose Finland RN, MSN, CDE Diabetes Coordinator Inpatient Glycemic Control Team Team Pager: [phone] (8a-5p)

## 2019-10-24 NOTE — ED Notes (Signed)
This RN to bedside, pt resting in bed with eyes closed, awakens easily with verbal stimuli. Room temp increased per patient request. Call bell remains within reach of patient.

## 2019-10-24 NOTE — ED Notes (Signed)
Admitting MD made aware of patient request for anxiety medication.

## 2019-10-24 NOTE — ED Notes (Signed)
This RN notified by Jaclynn Guarneri, RN that patient refused IV Ativan, per Jaclynn Guarneri, RN, pt states is "feeling better and may take it a little later".

## 2019-10-24 NOTE — ED Notes (Signed)
CBG 254 

## 2019-10-24 NOTE — ED Notes (Signed)
This RN to bedside due to patient calling out. Pt states "my throat is on fire". This RN explained RT coming to put patient back on Hi-flow [place] as requested. This RN explained that while patient's O2 sats 99-100 at this time on BiPap, likely would no remain that way and patient would desat again if he came off Bipap as he was previously in the day. Pt states "I'll just go home then". This RN highly encouraged patient to stay and not leave due to patients oxygen levels.

## 2019-10-24 NOTE — ED Notes (Signed)
BiPap placed on patient by RT at this time. Pt repositioned in bed by this RN and Jaclynn Guarneri, Charity fundraiser. Pt tolerated well. Pt O2 sats immediately noted to increase to mid 90's while on Bipap. Pt call bell within reach at this time. Pt denies further needs.

## 2019-10-24 NOTE — ED Notes (Signed)
This RN and Jaclynn Guarneri, RN to bedside, introduced selves to patient. Pt noted to be sitting up in hospital bed at this time. Pt with noted [place] and NRB at 15L in place with sats 79-86%. Pulse ox site changed to L index finger at this time. Pt A&O x4, able to speak in full and complete sentences. Pt with personal cell phone at bedside and within reach. Call bell within reach. NSR on the monitor, BP WNL. MRSA swab collected by this and Jaclynn Guarneri, RN, CBG obtained. Pt denies further needs.

## 2019-10-24 NOTE — ED Notes (Signed)
Pt placed back on Hi-Flow [place] at this time by RT. Admitting MD Nelson Chimes made aware at this time.

## 2019-10-24 NOTE — ED Notes (Signed)
Admitting MD made aware of patient's O2 sats while on NRB and Hi-Flow, aware that patient is mentating normally, no new orders received.

## 2019-10-24 NOTE — ED Notes (Signed)
RT made aware of order for stat ABG.

## 2019-10-24 NOTE — ED Notes (Signed)
Pt sitting up on side of bed attempting to urinate. Pt's O2 sats noted to be 51% at this time. Pt taken off of Bipap per his request. Pt called out to notify this RN that he could not pee and that he did not want a catheter.

## 2019-10-24 NOTE — ED Notes (Signed)
Pt had pulled off HF [place] and NRB, sitting on side of bed. Pt was in extreme resp distress. Pt was placed back in bed and RT called to place pt back on Bipap. Pts sats were 18% off of O2. Pt anxious. Placed back on Bipap and ativan given. Pts sats increased back up to 91% on Bipap

## 2019-10-24 NOTE — ED Notes (Signed)
RT Notified that patient requesting to be placed back on Bipap at this time, states he is ready to go back on Bipap and is "ready to work with [us (staff)]".

## 2019-10-25 ENCOUNTER — Inpatient Hospital Stay: Payer: Self-pay

## 2019-10-25 DIAGNOSIS — U071 COVID-19: Principal | ICD-10-CM

## 2019-10-25 DIAGNOSIS — J9600 Acute respiratory failure, unspecified whether with hypoxia or hypercapnia: Secondary | ICD-10-CM

## 2019-10-25 LAB — CBC WITH DIFFERENTIAL/PLATELET
Abs Immature Granulocytes: 0.42 10*3/uL — ABNORMAL HIGH (ref 0.00–0.07)
Basophils Absolute: 0 10*3/uL (ref 0.0–0.1)
Basophils Relative: 0 %
Eosinophils Absolute: 0 10*3/uL (ref 0.0–0.5)
Eosinophils Relative: 0 %
HCT: 35.9 % — ABNORMAL LOW (ref 39.0–52.0)
Hemoglobin: 13.1 g/dL (ref 13.0–17.0)
Immature Granulocytes: 2 %
Lymphocytes Relative: 5 %
Lymphs Abs: 1 10*3/uL (ref 0.7–4.0)
MCH: 27 pg (ref 26.0–34.0)
MCHC: 36.5 g/dL — ABNORMAL HIGH (ref 30.0–36.0)
MCV: 74 fL — ABNORMAL LOW (ref 80.0–100.0)
Monocytes Absolute: 1.1 10*3/uL — ABNORMAL HIGH (ref 0.1–1.0)
Monocytes Relative: 5 %
Neutro Abs: 17.5 10*3/uL — ABNORMAL HIGH (ref 1.7–7.7)
Neutrophils Relative %: 88 %
Platelets: 181 10*3/uL (ref 150–400)
RBC: 4.85 MIL/uL (ref 4.22–5.81)
RDW: 14.4 % (ref 11.5–15.5)
Smear Review: NORMAL
WBC: 19.9 10*3/uL — ABNORMAL HIGH (ref 4.0–10.5)
nRBC: 0.7 % — ABNORMAL HIGH (ref 0.0–0.2)

## 2019-10-25 LAB — PHOSPHORUS: Phosphorus: 3 mg/dL (ref 2.5–4.6)

## 2019-10-25 LAB — COMPREHENSIVE METABOLIC PANEL
ALT: 116 U/L — ABNORMAL HIGH (ref 0–44)
AST: 164 U/L — ABNORMAL HIGH (ref 15–41)
Albumin: 2.9 g/dL — ABNORMAL LOW (ref 3.5–5.0)
Alkaline Phosphatase: 136 U/L — ABNORMAL HIGH (ref 38–126)
Anion gap: 10 (ref 5–15)
BUN: 27 mg/dL — ABNORMAL HIGH (ref 6–20)
CO2: 25 mmol/L (ref 22–32)
Calcium: 7.7 mg/dL — ABNORMAL LOW (ref 8.9–10.3)
Chloride: 102 mmol/L (ref 98–111)
Creatinine, Ser: 0.79 mg/dL (ref 0.61–1.24)
GFR calc Af Amer: >60 mL/min (ref >60)
GFR calc non Af Amer: >60 mL/min (ref >60)
Glucose, Bld: 231 mg/dL — ABNORMAL HIGH (ref 70–99)
Potassium: 4 mmol/L (ref 3.5–5.1)
Sodium: 137 mmol/L (ref 135–145)
Total Bilirubin: 0.9 mg/dL (ref 0.3–1.2)
Total Protein: 6.9 g/dL (ref 6.5–8.1)

## 2019-10-25 LAB — FIBRIN DERIVATIVES D-DIMER (ARMC ONLY): Fibrin derivatives D-dimer (ARMC): >7500 ng/mL (FEU) — ABNORMAL HIGH (ref 0.00–499.00)

## 2019-10-25 LAB — FERRITIN: Ferritin: 2181 ng/mL — ABNORMAL HIGH (ref 24–336)

## 2019-10-25 LAB — C-REACTIVE PROTEIN: CRP: 19.5 mg/dL — ABNORMAL HIGH (ref <1.0)

## 2019-10-25 LAB — GLUCOSE, CAPILLARY
Glucose-Capillary: 222 mg/dL — ABNORMAL HIGH (ref 70–99)
Glucose-Capillary: 216 mg/dL — ABNORMAL HIGH (ref 70–99)
Glucose-Capillary: 226 mg/dL — ABNORMAL HIGH (ref 70–99)
Glucose-Capillary: 217 mg/dL — ABNORMAL HIGH (ref 70–99)
Glucose-Capillary: 186 mg/dL — ABNORMAL HIGH (ref 70–99)
Glucose-Capillary: 168 mg/dL — ABNORMAL HIGH (ref 70–99)
Glucose-Capillary: 170 mg/dL — ABNORMAL HIGH (ref 70–99)

## 2019-10-25 LAB — PROTIME-INR
INR: 6.6 (ref 0.8–1.2)
Prothrombin Time: 55.8 seconds — ABNORMAL HIGH (ref 11.4–15.2)

## 2019-10-25 LAB — PROCALCITONIN: Procalcitonin: 0.85 ng/mL

## 2019-10-25 LAB — MAGNESIUM: Magnesium: 2.6 mg/dL — ABNORMAL HIGH (ref 1.7–2.4)

## 2019-10-25 LAB — TSH: TSH: 0.356 u[IU]/mL (ref 0.350–4.500)

## 2019-10-25 LAB — T4, FREE: Free T4: 0.93 ng/dL (ref 0.61–1.12)

## 2019-10-25 MED ORDER — CHLORHEXIDINE GLUCONATE 0.12 % MT SOLN
15.0000 mL | Freq: Two times a day (BID) | OROMUCOSAL | Status: DC
  Administered 2019-10-25 – 2019-10-27 (×4): 15 mL via OROMUCOSAL
  Filled 2019-10-25 (×3): qty 15

## 2019-10-25 MED ORDER — INSULIN GLARGINE 100 UNIT/ML ~~LOC~~ SOLN
65.0000 [IU] | Freq: Every day | SUBCUTANEOUS | Status: DC
  Administered 2019-10-26 – 2019-10-27 (×2): 65 [IU] via SUBCUTANEOUS
  Filled 2019-10-25 (×2): qty 0.65

## 2019-10-25 MED ORDER — HALOPERIDOL LACTATE 5 MG/ML IJ SOLN
INTRAMUSCULAR | Status: AC
  Filled 2019-10-25: qty 1

## 2019-10-25 MED ORDER — CHLORHEXIDINE GLUCONATE CLOTH 2 % EX PADS
6.0000 | MEDICATED_PAD | Freq: Every day | CUTANEOUS | Status: DC
  Administered 2019-10-25 – 2019-11-02 (×8): 6 via TOPICAL

## 2019-10-25 MED ORDER — LORAZEPAM 2 MG/ML IJ SOLN
0.5000 mg | INTRAMUSCULAR | Status: DC | PRN
  Administered 2019-10-27 – 2019-10-30 (×2): 1 mg via INTRAVENOUS
  Filled 2019-10-25 (×2): qty 1

## 2019-10-25 MED ORDER — SODIUM CHLORIDE 0.9% FLUSH: 10.0000 mL | INTRAVENOUS | Status: DC | PRN

## 2019-10-25 MED ORDER — SODIUM CHLORIDE 0.9% FLUSH
10.0000 mL | Freq: Two times a day (BID) | INTRAVENOUS | Status: DC
  Administered 2019-10-25 – 2019-10-26 (×2): 10 mL
  Administered 2019-10-26: 30 mL
  Administered 2019-10-27: 10 mL
  Administered 2019-10-27: 30 mL
  Administered 2019-10-28 – 2019-11-02 (×9): 10 mL

## 2019-10-25 MED ORDER — HALOPERIDOL LACTATE 5 MG/ML IJ SOLN
2.0000 mg | Freq: Once | INTRAMUSCULAR | Status: AC
  Administered 2019-10-25: 2 mg via INTRAVENOUS

## 2019-10-25 MED ORDER — ORAL CARE MOUTH RINSE
15.0000 mL | Freq: Two times a day (BID) | OROMUCOSAL | Status: DC
  Administered 2019-10-25 – 2019-10-27 (×6): 15 mL via OROMUCOSAL

## 2019-10-25 MED ORDER — DEXMEDETOMIDINE HCL IN NACL 400 MCG/100ML IV SOLN
0.4000 ug/kg/h | INTRAVENOUS | Status: DC
  Administered 2019-10-25 (×3): 1.2 ug/kg/h via INTRAVENOUS
  Administered 2019-10-25: 0.6 ug/kg/h via INTRAVENOUS
  Administered 2019-10-25: 1 ug/kg/h via INTRAVENOUS
  Administered 2019-10-25: 0.543 ug/kg/h via INTRAVENOUS
  Administered 2019-10-25: 1.2 ug/kg/h via INTRAVENOUS
  Administered 2019-10-26: 1 ug/kg/h via INTRAVENOUS
  Administered 2019-10-26 (×3): 1.2 ug/kg/h via INTRAVENOUS
  Filled 2019-10-25 (×11): qty 100

## 2019-10-25 NOTE — Progress Notes (Addendum)
ANTICOAGULATION CONSULT NOTE  Pharmacy Consult for Warfarin Dosing Indication: hx of  DVT  No Known Allergies  Patient Measurements: Height: 6\' 3"  (190.5 cm) Weight: (!) 147.3 kg (324 lb 11.8 oz) IBW/kg (Calculated) : 84.5  Vital Signs: Temp: 97.9 F (36.6 C) (08/17 0800) Temp Source: Axillary (08/17 0800) BP: 132/90 (08/17 0800) Pulse Rate: 99 (08/17 0800)  Labs: Recent Labs    November 07, 2019 1655 11-07-19 1655 10/23/19 0814 10/24/19 0446 10/25/19 1011  HGB 14.4   < >  --  13.0 13.1  HCT 39.9  --   --  36.5* 35.9*  PLT 244  --   --  243 181  APTT  --   --  131*  --   --   LABPROT  --   --  40.3* 42.4* 55.8*  INR  --   --  4.3* 4.6* 6.6*  CREATININE 1.53*  --   --  0.91 0.79   < > = values in this interval not displayed.    Estimated Creatinine Clearance: 157.9 mL/min (by C-G formula based on SCr of 0.79 mg/dL).  Medical History: Past Medical History:  Diagnosis Date  . Diabetes mellitus without complication (HCC)    Medications:  Scheduled:  . albuterol  2 puff Inhalation Q6H  . amLODipine  10 mg Oral Daily  . vitamin C  500 mg Oral Daily  . atorvastatin  80 mg Oral Daily  . baricitinib  4 mg Oral Daily  . chlorhexidine  15 mL Mouth Rinse BID  . Chlorhexidine Gluconate Cloth  6 each Topical Daily  . colchicine  0.6 mg Oral Daily  . dexamethasone (DECADRON) injection  6 mg Intravenous Q24H  . haloperidol lactate      . insulin aspart  0-20 Units Subcutaneous Q4H  . [START ON 10/26/2019] insulin glargine  65 Units Subcutaneous Daily  . Ipratropium-Albuterol  1 puff Inhalation Q6H  . linagliptin  5 mg Oral Daily  . lisinopril  40 mg Oral Daily  . mouth rinse  15 mL Mouth Rinse q12n4p  . multivitamin with minerals  1 tablet Oral Daily  . thiamine injection  100 mg Intravenous Daily  . Warfarin - Pharmacist Dosing Inpatient   Does not apply q1600  . zinc sulfate  220 mg Oral Daily    Assessment: 59 year old male with history of DM, HTN, and DVT. Patient is  COVID +. Patient reports taking 11 mg daily.  Goal of Therapy:  INR 2-3 Monitor platelets by anticoagulation protocol: Yes   Date INR Dose  PTA ---- 11 mg  8/15 4.3 Hold  8/16 4.6 Hold  8/17 6.6 HOLD   Suspect INR increase, correlated with worsening transaminitis, is resultant of liver dysfunction.    Plan:  Continue to hold warfarin. INR trending up. LFTs improved from yesterday. Daily INR.  9/17, PharmD 10/25/2019 12:51 PM

## 2019-10-25 NOTE — ED Notes (Addendum)
Pt observed standing at end of bed by this RN and Nicki [place] NT disconnected from monitoring equipment and bipap, foley bleeding noted. Pt then fell to knees and not responding to this RN, fixed gaze ahead. Pt placed back on bipap. Oxygen sats improved from 58% ra to 94%. Pt assisted back into bed. Foley irrigated, urine received. Telesitter order placed and set up in room.

## 2019-10-25 NOTE — Progress Notes (Signed)
Peripherally Inserted Central Catheter Placement  The IV Nurse has discussed with the patient and/or persons authorized to consent for the patient, the purpose of this procedure and the potential benefits and risks involved with this procedure.  The benefits include less needle sticks, lab draws from the catheter, and the patient may be discharged home with the catheter. Risks include, but not limited to, infection, bleeding, blood clot (thrombus formation), and puncture of an artery; nerve damage and irregular heartbeat and possibility to perform a PICC exchange if needed/ordered by physician.  Alternatives to this procedure were also discussed.  Bard Power PICC patient education guide, fact sheet on infection prevention and patient information card has been provided to patient /or left at bedside.    PICC Placement Documentation  PICC Triple Lumen 10/25/19 PICC Left Brachial 55 cm 0 cm (Active)  Indication for Insertion or Continuance of Line Vasoactive infusions 10/25/19 2025  Exposed Catheter (cm) 0 cm 10/25/19 2025  Site Assessment Clean;Dry;Intact 10/25/19 2025  Lumen #1 Status Blood return noted;Flushed;Saline locked 10/25/19 2025  Lumen #2 Status Blood return noted;Flushed;Saline locked 10/25/19 2025  Lumen #3 Status Blood return noted;Flushed;Saline locked 10/25/19 2025  Dressing Type Transparent;Occlusive;Securing device 10/25/19 2025  Dressing Status Clean;Dry;Intact;Antimicrobial disc in place 10/25/19 2025  Safety Lock Not Applicable 10/25/19 2025  Line Adjustment (NICU/IV Team Only) No 10/25/19 2025  Dressing Intervention New dressing 10/25/19 2025  Dressing Change Due 11/01/19 10/25/19 2025       Christeen Douglas 10/25/2019, 8:44 PM

## 2019-10-25 NOTE — Progress Notes (Signed)
Name: Spencer Woodward MRN: [id number] DOB: October 30, 1960    ADMISSION DATE:  10/18/2019   INITIAL CONSULTATION DATE: 10/23/2019  REFERRING MD: Lindajo Royal, MD    CHIEF COMPLAINT: Shortness of breath  BRIEF PATIENT DESCRIPTION: 59 year old male with PMH significant for uncontrolled type 2 DM, HLD, HTN, Thyroid nodule, gout, morbid obesity and pulmonary embolism presented to [facility] ER on 08/14 via EMS from home with generalized malaise, shortness of breath and chest discomfort with inspiration.  Admitted with acute hypoxic respiratory failure secondary to COVID-19 Pneumonia.  On high flow O2 alternating with BiPAP  SIGNIFICANT EVENTS  08/14: Pt admitted to stepdown unit with COVID pneumonia 08/16: Remains housed in EDP due to no rooms.  Still stepdown status, remains BiPAP dependent  STUDIES:  8/14:Chest XrayshowsInterval development of patchy bilateral airspace opacities which may represent pneumonia 8/14: CTA Chest Slightly suboptimal opacification of the main pulmonary artery, however no central or proximal segmental pulmonary embolism.Extensive multifocal airspace opacities, consistent with atypical viral pneumonia  CULTURES: 8/14:BCxpending  ANTIBIOTICS: Ceftriaxone 8/14> Azithromycin 8/14>  CC  follow up COVID 19 pneumonia  HPI On biPAP High risk for intubation   No Known Allergies  Scheduled Meds: . albuterol  2 puff Inhalation Q6H  . amLODipine  10 mg Oral Daily  . vitamin C  500 mg Oral Daily  . atorvastatin  80 mg Oral Daily  . baricitinib  4 mg Oral Daily  . chlorhexidine  15 mL Mouth Rinse BID  . Chlorhexidine Gluconate Cloth  6 each Topical Daily  . colchicine  0.6 mg Oral Daily  . dexamethasone (DECADRON) injection  6 mg Intravenous Q24H  . haloperidol lactate      . insulin aspart  0-20 Units Subcutaneous Q4H  . insulin glargine  60 Units Subcutaneous Daily  . Ipratropium-Albuterol  1 puff Inhalation Q6H  . linagliptin  5 mg Oral Daily  . lisinopril   40 mg Oral Daily  . mouth rinse  15 mL Mouth Rinse q12n4p  . multivitamin with minerals  1 tablet Oral Daily  . thiamine injection  100 mg Intravenous Daily  . Warfarin - Pharmacist Dosing Inpatient   Does not apply q1600  . zinc sulfate  220 mg Oral Daily   Continuous Infusions: . azithromycin Stopped (10/24/19 1912)  . cefTRIAXone (ROCEPHIN)  IV Stopped (10/24/19 1758)  . remdesivir 100 mg in NS 100 mL Stopped (10/24/19 1014)   PRN Meds:.acetaminophen, chlorpheniramine-HYDROcodone, guaiFENesin-dextromethorphan, LORazepam, ondansetron **OR** ondansetron (ZOFRAN) IV   REVIEW OF SYSTEMS  PATIENT IS UNABLE TO PROVIDE COMPLETE REVIEW OF SYSTEM S DUE TO SEVERE CRITICAL ILLNESS AND ENCEPHALOPATHY   VITAL SIGNS: Temp:  [98.6 F (37 C)] 98.6 F (37 C) (08/17 0527) Pulse Rate:  [97-120] 105 (08/17 0600) Resp:  [19-40] 22 (08/17 0600) BP: (113-159)/(73-100) 113/100 (08/17 0600) SpO2:  [69 %-99 %] 96 % (08/17 0600) FiO2 (%):  [100 %] 100 % (08/16 1130) Weight:  [147.3 kg] 147.3 kg (08/17 0527)  PHYSICAL EXAMINATION:  GENERAL:critically ill appearing, +resp distress on BIPAP HEAD: Normocephalic, atraumatic.  EYES: Pupils equal, round, reactive to light.  No scleral icterus.  MOUTH: Moist mucosal membrane. NECK: Supple. No thyromegaly. No nodules. No JVD.  PULMONARY: +rhonchi, +wheezing CARDIOVASCULAR: S1 and S2. Regular rate and rhythm. No murmurs, rubs, or gallops.  GASTROINTESTINAL: Soft, nontender, -distended. Positive bowel sounds.  MUSCULOSKELETAL: No swelling, clubbing, or edema.  NEUROLOGIC: obtunded SKIN:intact,warm,dry    Recent Labs  Lab 10/20/2019 1655 10/24/19 0446  NA 134* 137  K 4.3  4.1  CL 98 102  CO2 23 22  BUN 26* 26*  CREATININE 1.53* 0.91  GLUCOSE 408* 231*   Recent Labs  Lab 2019-11-19 1655 10/24/19 0446  HGB 14.4 13.0  HCT 39.9 36.5*  WBC 10.8* 13.8*  PLT 244 243   No results found.  ASSESSMENT / PLAN:  Severe COVID-19 infection, ARDS and  pneumonia/pneumonitis Continue IV steroids  IV remdisivir Aggressive pulm toilet recommended Pulmonary hygiene Continue proning as tolerated due to severe hypoxia   Maintain airborne and contact precautions  As needed bronchodilators (MDI) Vitamin C and zinc Antitussives High risk for intubation and death    H/o PE continue anticoagulation   ACUTE KIDNEY INJURY/Renal Failure -continue Foley Catheter-assess need -Avoid nephrotoxic agents -Follow urine output, BMP -Ensure adequate renal perfusion, optimize oxygenation -Renal dose medications  ELECTROLYTES -follow labs as needed -replace as needed -pharmacy consultation and following   DVT/GI PRX ordered and assessed TRANSFUSIONS AS NEEDED MONITOR FSBS I Assessed the need for Labs I Assessed the need for Foley I Assessed the need for Central Venous Line Family Discussion when available I Assessed the need for Mobilization I made an Assessment of medications to be adjusted accordingly Safety Risk assessment completed  CASE DISCUSSED IN MULTIDISCIPLINARY ROUNDS WITH ICU TEAM    Critical Care Time devoted to patient care services described in this note is 34 minutes.   Overall, patient is critically ill, prognosis is guarded.  Patient with Multiorgan failure and at high risk for cardiac arrest and death.     Lucie Leather, M.D.  [facility]  Medical Director [facility] Medical Director [facility] Cardio-Pulmonary Department

## 2019-10-26 DIAGNOSIS — J80 Acute respiratory distress syndrome: Secondary | ICD-10-CM

## 2019-10-26 LAB — COMPREHENSIVE METABOLIC PANEL
ALT: 110 U/L — ABNORMAL HIGH (ref 0–44)
AST: 122 U/L — ABNORMAL HIGH (ref 15–41)
Albumin: 2.8 g/dL — ABNORMAL LOW (ref 3.5–5.0)
Alkaline Phosphatase: 126 U/L (ref 38–126)
Anion gap: 12 (ref 5–15)
BUN: 32 mg/dL — ABNORMAL HIGH (ref 6–20)
CO2: 26 mmol/L (ref 22–32)
Calcium: 8 mg/dL — ABNORMAL LOW (ref 8.9–10.3)
Chloride: 106 mmol/L (ref 98–111)
Creatinine, Ser: 1.02 mg/dL (ref 0.61–1.24)
GFR calc Af Amer: >60 mL/min (ref >60)
GFR calc non Af Amer: >60 mL/min (ref >60)
Glucose, Bld: 182 mg/dL — ABNORMAL HIGH (ref 70–99)
Potassium: 4.6 mmol/L (ref 3.5–5.1)
Sodium: 144 mmol/L (ref 135–145)
Total Bilirubin: 1.3 mg/dL — ABNORMAL HIGH (ref 0.3–1.2)
Total Protein: 6.8 g/dL (ref 6.5–8.1)

## 2019-10-26 LAB — CBC WITH DIFFERENTIAL/PLATELET
Abs Immature Granulocytes: 0.73 10*3/uL — ABNORMAL HIGH (ref 0.00–0.07)
Basophils Absolute: 0.1 10*3/uL (ref 0.0–0.1)
Basophils Relative: 1 %
Eosinophils Absolute: 0 10*3/uL (ref 0.0–0.5)
Eosinophils Relative: 0 %
HCT: 36.1 % — ABNORMAL LOW (ref 39.0–52.0)
Hemoglobin: 13.4 g/dL (ref 13.0–17.0)
Immature Granulocytes: 3 %
Lymphocytes Relative: 4 %
Lymphs Abs: 0.8 10*3/uL (ref 0.7–4.0)
MCH: 27.3 pg (ref 26.0–34.0)
MCHC: 37.1 g/dL — ABNORMAL HIGH (ref 30.0–36.0)
MCV: 73.5 fL — ABNORMAL LOW (ref 80.0–100.0)
Monocytes Absolute: 0.9 10*3/uL (ref 0.1–1.0)
Monocytes Relative: 4 %
Neutro Abs: 19 10*3/uL — ABNORMAL HIGH (ref 1.7–7.7)
Neutrophils Relative %: 88 %
Platelets: 214 10*3/uL (ref 150–400)
RBC: 4.91 MIL/uL (ref 4.22–5.81)
RDW: 14.5 % (ref 11.5–15.5)
Smear Review: NORMAL
WBC: 21.6 10*3/uL — ABNORMAL HIGH (ref 4.0–10.5)
nRBC: 0.3 % — ABNORMAL HIGH (ref 0.0–0.2)

## 2019-10-26 LAB — BLOOD GAS, ARTERIAL
Acid-Base Excess: 1.8 mmol/L (ref 0.0–2.0)
Bicarbonate: 33.8 mmol/L — ABNORMAL HIGH (ref 20.0–28.0)
FIO2: 1
MECHVT: 550 mL
O2 Saturation: 99.7 %
PEEP: 20 cmH2O
Patient temperature: 37
RATE: 20 resp/min
pCO2 arterial: 97 mmHg (ref 32.0–48.0)
pH, Arterial: 7.15 — CL (ref 7.350–7.450)
pO2, Arterial: 225 mmHg — ABNORMAL HIGH (ref 83.0–108.0)

## 2019-10-26 LAB — FERRITIN: Ferritin: 2018 ng/mL — ABNORMAL HIGH (ref 24–336)

## 2019-10-26 LAB — GLUCOSE, CAPILLARY
Glucose-Capillary: 164 mg/dL — ABNORMAL HIGH (ref 70–99)
Glucose-Capillary: 175 mg/dL — ABNORMAL HIGH (ref 70–99)
Glucose-Capillary: 177 mg/dL — ABNORMAL HIGH (ref 70–99)
Glucose-Capillary: 178 mg/dL — ABNORMAL HIGH (ref 70–99)
Glucose-Capillary: 184 mg/dL — ABNORMAL HIGH (ref 70–99)
Glucose-Capillary: 191 mg/dL — ABNORMAL HIGH (ref 70–99)

## 2019-10-26 LAB — PHOSPHORUS: Phosphorus: 3.7 mg/dL (ref 2.5–4.6)

## 2019-10-26 LAB — HIV ANTIBODY (ROUTINE TESTING W REFLEX): HIV Screen 4th Generation wRfx: NONREACTIVE

## 2019-10-26 LAB — PROTIME-INR
INR: 7.3 (ref 0.8–1.2)
Prothrombin Time: 60.7 seconds — ABNORMAL HIGH (ref 11.4–15.2)

## 2019-10-26 LAB — FIBRIN DERIVATIVES D-DIMER (ARMC ONLY): Fibrin derivatives D-dimer (ARMC): >10000 ng/mL (FEU) — ABNORMAL HIGH (ref 0.00–499.00)

## 2019-10-26 LAB — MAGNESIUM: Magnesium: 2.9 mg/dL — ABNORMAL HIGH (ref 1.7–2.4)

## 2019-10-26 LAB — C-REACTIVE PROTEIN: CRP: 24.4 mg/dL — ABNORMAL HIGH (ref <1.0)

## 2019-10-26 MED ORDER — FENTANYL CITRATE (PF) 100 MCG/2ML IJ SOLN: 200.0000 ug | INTRAMUSCULAR | Status: AC

## 2019-10-26 MED ORDER — VECURONIUM BROMIDE 10 MG IV SOLR
10.0000 mg | INTRAVENOUS | Status: DC | PRN
  Administered 2019-10-26 (×2): 20 mg via INTRAVENOUS
  Administered 2019-10-27: 10 mg via INTRAVENOUS
  Administered 2019-10-27 (×2): 20 mg via INTRAVENOUS
  Administered 2019-10-30 (×3): 10 mg via INTRAVENOUS
  Administered 2019-10-31: 20 mg via INTRAVENOUS
  Administered 2019-10-31: 10 mg via INTRAVENOUS
  Administered 2019-10-31 (×2): 20 mg via INTRAVENOUS
  Administered 2019-10-31: 10 mg via INTRAVENOUS
  Administered 2019-11-01: 20 mg via INTRAVENOUS
  Administered 2019-11-01: 10 mg via INTRAVENOUS
  Administered 2019-11-01 – 2019-11-02 (×2): 20 mg via INTRAVENOUS
  Administered 2019-11-02 (×2): 10 mg via INTRAVENOUS
  Filled 2019-10-26: qty 20
  Filled 2019-10-26 (×2): qty 10
  Filled 2019-10-26 (×2): qty 20
  Filled 2019-10-26 (×2): qty 10
  Filled 2019-10-26 (×5): qty 20
  Filled 2019-10-26: qty 10
  Filled 2019-10-26 (×2): qty 20
  Filled 2019-10-26: qty 10
  Filled 2019-10-26 (×4): qty 20
  Filled 2019-10-26: qty 10

## 2019-10-26 MED ORDER — VECURONIUM BROMIDE 10 MG IV SOLR: 20.0000 mg | INTRAVENOUS | Status: AC

## 2019-10-26 MED ORDER — MIDAZOLAM HCL 2 MG/2ML IJ SOLN: 4.0000 mg | INTRAMUSCULAR | Status: AC

## 2019-10-26 MED ORDER — DOCUSATE SODIUM 50 MG/5ML PO LIQD
100.0000 mg | Freq: Two times a day (BID) | ORAL | Status: DC
  Administered 2019-10-29 – 2019-10-31 (×6): 100 mg via ORAL
  Filled 2019-10-26 (×6): qty 10

## 2019-10-26 MED ORDER — PROPOFOL 1000 MG/100ML IV EMUL
INTRAVENOUS | Status: AC
  Administered 2019-10-26: 20 ug/kg/min via INTRAVENOUS
  Filled 2019-10-26: qty 100

## 2019-10-26 MED ORDER — NOREPINEPHRINE 16 MG/250ML-% IV SOLN
0.0000 ug/min | INTRAVENOUS | Status: DC
  Administered 2019-10-26: 10 ug/min via INTRAVENOUS
  Administered 2019-10-26: 40 ug/min via INTRAVENOUS
  Administered 2019-10-27 (×2): 30 ug/min via INTRAVENOUS
  Administered 2019-10-27: 40 ug/min via INTRAVENOUS
  Administered 2019-10-28: 45 ug/min via INTRAVENOUS
  Administered 2019-10-28: 38 ug/min via INTRAVENOUS
  Administered 2019-10-29: 20 ug/min via INTRAVENOUS
  Administered 2019-10-29: 40 ug/min via INTRAVENOUS
  Filled 2019-10-26 (×13): qty 250

## 2019-10-26 MED ORDER — ACETAMINOPHEN 650 MG RE SUPP
650.0000 mg | Freq: Four times a day (QID) | RECTAL | Status: DC | PRN
  Administered 2019-10-26 – 2019-11-02 (×4): 650 mg via RECTAL
  Filled 2019-10-26 (×4): qty 1

## 2019-10-26 MED ORDER — MIDAZOLAM 50MG/50ML (1MG/ML) PREMIX INFUSION
0.0000 mg/h | INTRAVENOUS | Status: DC
  Administered 2019-10-26: 2 mg/h via INTRAVENOUS
  Administered 2019-10-26 – 2019-10-27 (×4): 10 mg/h via INTRAVENOUS
  Administered 2019-10-27: 9 mg/h via INTRAVENOUS
  Administered 2019-10-27: 10 mg/h via INTRAVENOUS
  Administered 2019-10-28: 4 mg/h via INTRAVENOUS
  Administered 2019-10-28: 8 mg/h via INTRAVENOUS
  Administered 2019-10-28: 6 mg/h via INTRAVENOUS
  Filled 2019-10-26 (×10): qty 50

## 2019-10-26 MED ORDER — VECURONIUM BROMIDE 10 MG IV SOLR
INTRAVENOUS | Status: AC
  Filled 2019-10-26: qty 20

## 2019-10-26 MED ORDER — FENTANYL 2500MCG IN NS 250ML (10MCG/ML) PREMIX INFUSION
INTRAVENOUS | Status: AC
  Administered 2019-10-26: 50 ug/h via INTRAVENOUS
  Filled 2019-10-26: qty 250

## 2019-10-26 MED ORDER — PROPOFOL 1000 MG/100ML IV EMUL
5.0000 ug/kg/min | INTRAVENOUS | Status: DC
  Administered 2019-10-26: 30 ug/kg/min via INTRAVENOUS
  Filled 2019-10-26: qty 100

## 2019-10-26 MED ORDER — FENTANYL 2500MCG IN NS 250ML (10MCG/ML) PREMIX INFUSION
0.0000 ug/h | INTRAVENOUS | Status: DC
  Administered 2019-10-26 – 2019-10-27 (×4): 400 ug/h via INTRAVENOUS
  Administered 2019-10-27 – 2019-10-28 (×4): 350 ug/h via INTRAVENOUS
  Administered 2019-10-29: 300 ug/h via INTRAVENOUS
  Filled 2019-10-26 (×9): qty 250

## 2019-10-26 MED ORDER — VECURONIUM BROMIDE 10 MG IV SOLR
INTRAVENOUS | Status: AC
  Administered 2019-10-26: 20 mg via INTRAVENOUS
  Filled 2019-10-26: qty 10

## 2019-10-26 MED ORDER — MIDAZOLAM BOLUS VIA INFUSION
1.0000 mg | INTRAVENOUS | Status: DC | PRN
  Administered 2019-10-27 (×2): 1 mg via INTRAVENOUS
  Filled 2019-10-26: qty 1

## 2019-10-26 MED ORDER — MIDAZOLAM HCL 2 MG/2ML IJ SOLN
INTRAMUSCULAR | Status: AC
  Administered 2019-10-26: 4 mg via INTRAVENOUS
  Filled 2019-10-26: qty 4

## 2019-10-26 MED ORDER — VASOPRESSIN 20 UNITS/100 ML INFUSION FOR SHOCK
0.0000 [IU]/min | INTRAVENOUS | Status: DC
  Administered 2019-10-26 – 2019-10-29 (×8): 0.03 [IU]/min via INTRAVENOUS
  Filled 2019-10-26 (×10): qty 100

## 2019-10-26 MED ORDER — FENTANYL CITRATE (PF) 100 MCG/2ML IJ SOLN
INTRAMUSCULAR | Status: AC
  Administered 2019-10-26: 200 ug via INTRAVENOUS
  Filled 2019-10-26: qty 4

## 2019-10-26 MED ORDER — POLYETHYLENE GLYCOL 3350 17 G PO PACK
17.0000 g | PACK | Freq: Every day | ORAL | Status: DC
  Administered 2019-10-28 – 2019-10-31 (×4): 17 g via ORAL
  Filled 2019-10-26 (×5): qty 1

## 2019-10-26 NOTE — Progress Notes (Signed)
Patient was intubated earlier this shift.  OG was unable to be placed by MD via visualization with the glidascope.  Patient currently sedated with Versed and Fentanyl.  Propfol was used briefly but patient's blood pressure didn't tolerate the Propofol.  PRN vecuronium used once when patient began to wake up and breath about 40 breaths/min.  Patient is currently maxed on quad-strength Levophed.  Family has utilized the E-Link call-in option to see the patient in the room this shift.

## 2019-10-26 NOTE — Procedures (Signed)
Endotracheal Intubation: Patient required placement of an artificial airway secondary to Respiratory Failure  Consent: Emergent.   Hand washing performed prior to starting the procedure.   Medications administered for sedation prior to procedure:  Midazolam 4 mg IV,  Vecuronium 10 mg IV, Fentanyl 100 mcg IV.    A time out procedure was called and correct patient, name, & ID confirmed. Needed supplies and equipment were assembled and checked to include ETT, 10 ml syringe, Glidescope, Mac and Miller blades, suction, oxygen and bag mask valve, end tidal CO2 monitor.   Patient was positioned to align the mouth and pharynx to facilitate visualization of the glottis.   Heart rate, SpO2 and blood pressure was continuously monitored during the procedure. Pre-oxygenation was conducted prior to intubation and endotracheal tube was placed through the vocal cords into the trachea.     The artificial airway was placed under direct visualization via glidescope route using a 8.0 ETT on the first attempt.  ETT was secured at 25 cm mark.  Placement was confirmed by auscuitation of lungs with good breath sounds bilaterally and no stomach sounds.  Condensation was noted on endotracheal tube.   Pulse ox 98%.  CO2 detector in place with appropriate color change.   Complications: None .   Operator: Damir Leung.   Chest radiograph ordered and pending.     Corrin Parker, M.D.  [facility]  Medical Director [place] Director [facility] Cardio-Pulmonary Department

## 2019-10-26 NOTE — Progress Notes (Signed)
ANTICOAGULATION CONSULT NOTE  Pharmacy Consult for Warfarin Dosing Indication: hx of  DVT  No Known Allergies  Patient Measurements: Height: 6\' 3"  (190.5 cm) Weight: (!) 147.3 kg (324 lb 11.8 oz) IBW/kg (Calculated) : 84.5  Vital Signs: Temp: 100.3 F (37.9 C) (08/18 0800) Temp Source: Axillary (08/18 0800) BP: 136/75 (08/18 0900) Pulse Rate: 73 (08/18 0900)  Labs: Recent Labs    10/24/19 0446 10/24/19 0446 10/25/19 1011 10/26/19 0514  HGB 13.0   < > 13.1 13.4  HCT 36.5*  --  35.9* 36.1*  PLT 243  --  181 214  LABPROT 42.4*  --  55.8* 60.7*  INR 4.6*  --  6.6* 7.3*  CREATININE 0.91  --  0.79 1.02   < > = values in this interval not displayed.    Estimated Creatinine Clearance: 123.9 mL/min (by C-G formula based on SCr of 1.02 mg/dL).  Medical History: Past Medical History:  Diagnosis Date   Diabetes mellitus without complication [facility])     Assessment: 59 year old male with history of DM, HTN, and PE. Patient is COVID +. Patient on warfarin PTA for h/o PE several years ago. 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 He reports taking warfarin 11 mg daily.  Goal of Therapy:  INR 2-3 Monitor platelets by anticoagulation protocol: Yes   Date INR Dose  8/15 4.3 HOLD  8/16 4.6 HOLD  8/17 6.6 HOLD  8/18 7.3 HOLD       Plan:  Continue to hold warfarin. INR trending up. Concern for liver dysfunction with increase in LFTs, however LFTs continue to improve. No signs/symptoms of bleeding at this time. No intervention needed at this time for elevated INR. Will continue to follow daily INRs.  9/18, PharmD 10/26/2019 12:43 PM

## 2019-10-26 NOTE — Progress Notes (Signed)
Assisted tele visit to patient with daughter.  Thomas, Rilee Knoll Renee, RN   

## 2019-10-26 NOTE — Progress Notes (Signed)
CRITICAL CARE NOTE 59 year old male with PMH significant for uncontrolled type 2 DM, HLD, HTN, Thyroid nodule, gout, morbid obesity and pulmonary embolism presented to [facility] ER on 08/14 via EMS from home with generalized malaise, shortness of breath and chest discomfort with inspiration.  Admitted with acute hypoxic respiratory failure secondary to COVID-19 Pneumonia.  On high flow O2 alternating with BiPAP  SIGNIFICANT EVENTS 08/14: Pt admitted tostepdown unit with COVID pneumonia 08/16: Remains housed in EDP due to no rooms.  Still stepdown status, remains BiPAP dependent 8/17 remains on biPAP, on precedex   STUDIES: 8/14:Chest XrayshowsInterval development of patchy bilateral airspace opacities which may represent pneumonia 8/14: CTA Chest Slightly suboptimal opacification of the main pulmonary artery, however no central or proximal segmental pulmonary embolism.Extensive multifocal airspace opacities, consistent with atypical viral pneumonia  CULTURES: 8/14:BCxpending  ANTIBIOTICS: Ceftriaxone 8/14> Azithromycin8/14>   CC  follow up respiratory failure  SUBJECTIVE Patient remains critically ill Prognosis is guarded High risk for intubation    BP 137/79   Pulse 74   Temp 100.2 F (37.9 C) (Oral)   Resp (!) 26   Ht 6\' 3"  (1.905 m)   Wt (!) 147.3 kg   SpO2 97%   BMI 40.59 kg/m    I/O last 3 completed shifts: In: 1503.8 [I.V.:859.2; IV Piggyback:644.6] Out: 1305 [Urine:1305] No intake/output data recorded.  SpO2: 97 % O2 Flow Rate (L/min): 50 L/min FiO2 (%): 100 %  Estimated body mass index is 40.59 kg/m as calculated from the following:   Height as of this encounter: 6\' 3"  (1.905 m).   Weight as of this encounter: 147.3 kg.  SIGNIFICANT EVENTS   REVIEW OF SYSTEMS  PATIENT IS UNABLE TO PROVIDE COMPLETE REVIEW OF SYSTEMS DUE TO SEVERE CRITICAL ILLNESS        PHYSICAL EXAMINATION:  GENERAL:critically ill appearing, +resp distress on  biPAP HEAD: Normocephalic, atraumatic.  EYES: Pupils equal, round, reactive to light.  No scleral icterus.  MOUTH: Moist mucosal membrane. NECK: Supple.  PULMONARY: +rhonchi, +wheezing CARDIOVASCULAR: S1 and S2. Regular rate and rhythm. No murmurs, rubs, or gallops.  GASTROINTESTINAL: Soft, nontender, -distended.  Positive bowel sounds.   MUSCULOSKELETAL: No swelling, clubbing, or edema.  NEUROLOGIC: obtunded, GCS<8 SKIN:intact,warm,dry  MEDICATIONS: I have reviewed all medications and confirmed regimen as documented   CULTURE RESULTS   Recent Results (from the past 240 hour(s))  Blood Culture (routine x 2)     Status: None (Preliminary result)   Collection Time: 11-19-19  4:55 PM   Specimen: BLOOD  Result Value Ref Range Status   Specimen Description BLOOD BLOOD LEFT FOREARM  Final   Special Requests   Final    BOTTLES DRAWN AEROBIC AND ANAEROBIC Blood Culture results may not be optimal due to an excessive volume of blood received in culture bottles   Culture   Final    NO GROWTH 3 DAYS Performed at [facility], [address], [place], [address] [place]    Report Status PENDING  Incomplete  SARS Coronavirus 2 by RT PCR (hospital order, performed in [facility] hospital lab) Nasopharyngeal Nasopharyngeal Swab     Status: Abnormal   Collection Time: 11/19/2019  5:03 PM   Specimen: Nasopharyngeal Swab  Result Value Ref Range Status   SARS Coronavirus 2 POSITIVE (A) NEGATIVE Final    Comment: RESULT CALLED TO, READ BACK BY AND VERIFIED WITH: APRIL BRUMGARD 11/19/19 AT 2027 HS (NOTE) SARS-CoV-2 target nucleic acids are DETECTED  SARS-CoV-2 RNA is generally detectable in upper respiratory  specimens  during the acute phase of infection.  Positive results are indicative  of the presence of the identified virus, but do not rule out bacterial infection or co-infection with other pathogens not detected by the test.  Clinical correlation with patient history and  other  diagnostic information is necessary to determine patient infection status.  The expected result is negative.  Fact Sheet for Patients:   BoilerBrush.com.cy   Fact Sheet for Healthcare Providers:   https://pope.com/    This test is not yet approved or cleared by the [place] FDA and  has been authorized for detection and/or diagnosis of SARS-CoV-2 by FDA under an Emergency Use Authorization (EUA).  This EUA will remain in effect (meaning this tes t can be used) for the duration of  the COVID-19 declaration under Section 564(b)(1) of the Act, 21 U.S.C. section 360-bbb-3(b)(1), unless the authorization is terminated or revoked sooner.  Performed at [facility], [address] [place], [place] [address]   Blood Culture (routine x 2)     Status: None (Preliminary result)   Collection Time: 10-26-2019  6:01 PM   Specimen: BLOOD  Result Value Ref Range Status   Specimen Description BLOOD RH  Final   Special Requests   Final    BOTTLES DRAWN AEROBIC AND ANAEROBIC Blood Culture adequate volume   Culture   Final    NO GROWTH 3 DAYS Performed at [facility], [address], [place], [place] [address]    Report Status PENDING  Incomplete  MRSA PCR Screening     Status: None   Collection Time: 10/23/19  7:43 AM   Specimen: Nasal Mucosa; Nasopharyngeal  Result Value Ref Range Status   MRSA by PCR NEGATIVE NEGATIVE Final    Comment:        The GeneXpert MRSA Assay (FDA approved for NASAL specimens only), is one component of a comprehensive MRSA colonization surveillance program. It is not intended to diagnose MRSA infection nor to guide or monitor treatment for MRSA infections. Performed at [facility], [address] [place] [address] [place], [place] [address]           Indwelling Urinary Catheter continued, requirement due to   Reason to continue Indwelling Urinary Catheter strict Intake/Output monitoring  for hemodynamic instability   Central Line/ continued, requirement due to  Reason to continue [employer] Monitoring of central venous pressure or other hemodynamic parameters and poor IV access   Ventilator continued, requirement due to severe respiratory failure   Ventilator Sedation RASS 0 to -2      ASSESSMENT AND PLAN SYNOPSIS  Severe COVID-19 infection, ARDS and pneumonia/pneumonitis Continue IV steroids  THERAPY IV remdisivir +BARCITINIB Pulmonary hygiene Continue proning as tolerated due to severe hypoxia   Maintain airborne and contact precautions  As needed bronchodilators (MDI) Vitamin C and zinc Antitussives High risk for intubation and death   H/o PE Hold coumadin   Severe ACUTE Hypoxic and Hypercapnic Respiratory Failure Plan for intubation today  ACUTE DIASTOLIC CARDIAC FAILURE- -oxygen as needed -Lasix as tolerated   Morbid obesity, possible OSA.   Will certainly impact respiratory mechanics,  ACUTE KIDNEY INJURY/Renal Failure -continue Foley Catheter-assess need -Avoid nephrotoxic agents -Follow urine output, BMP -Ensure adequate renal perfusion, optimize oxygenation -Renal dose medications     NEUROLOGY Acute toxic metabolic encephalopathy due to COVID   CARDIAC ICU monitoring  ID -continue IV abx as prescibed -follow up cultures  GI GI PROPHYLAXIS as indicated  NUTRITIONAL STATUS Nutrition Status:  DIET-->NPO Constipation protocol as indicated  ENDO - will use ICU hypoglycemic\Hyperglycemia protocol if indicated     ELECTROLYTES -follow labs as needed -replace as needed -pharmacy consultation and following   DVT/GI PRX ordered and assessed TRANSFUSIONS AS NEEDED MONITOR FSBS I Assessed the need for Labs I Assessed the need for Foley I Assessed the need for Central Venous Line Family Discussion when available I Assessed the need for Mobilization I made an Assessment of medications to be adjusted  accordingly Safety Risk assessment completed   CASE DISCUSSED IN MULTIDISCIPLINARY ROUNDS WITH ICU TEAM  Critical Care Time devoted to patient care services described in this note is 34 minutes.   Overall, patient is critically ill, prognosis is guarded.  Patient with Multiorgan failure and at high risk for cardiac arrest and death.    Lucie Leather, M.D.  [facility]  Medical Director [facility] Medical Director [facility] Cardio-Pulmonary Department

## 2019-10-27 ENCOUNTER — Inpatient Hospital Stay: Payer: 59

## 2019-10-27 DIAGNOSIS — J1282 Pneumonia due to coronavirus disease 2019: Secondary | ICD-10-CM

## 2019-10-27 LAB — COMPREHENSIVE METABOLIC PANEL
ALT: 148 U/L — ABNORMAL HIGH (ref 0–44)
AST: 156 U/L — ABNORMAL HIGH (ref 15–41)
Albumin: 2.6 g/dL — ABNORMAL LOW (ref 3.5–5.0)
Alkaline Phosphatase: 135 U/L — ABNORMAL HIGH (ref 38–126)
Anion gap: 14 (ref 5–15)
BUN: 66 mg/dL — ABNORMAL HIGH (ref 6–20)
CO2: 23 mmol/L (ref 22–32)
Calcium: 7.3 mg/dL — ABNORMAL LOW (ref 8.9–10.3)
Chloride: 109 mmol/L (ref 98–111)
Creatinine, Ser: 4.12 mg/dL — ABNORMAL HIGH (ref 0.61–1.24)
GFR calc Af Amer: 17 mL/min — ABNORMAL LOW (ref >60)
GFR calc non Af Amer: 15 mL/min — ABNORMAL LOW (ref >60)
Glucose, Bld: 242 mg/dL — ABNORMAL HIGH (ref 70–99)
Potassium: 6.6 mmol/L (ref 3.5–5.1)
Sodium: 146 mmol/L — ABNORMAL HIGH (ref 135–145)
Total Bilirubin: 2 mg/dL — ABNORMAL HIGH (ref 0.3–1.2)
Total Protein: 6.8 g/dL (ref 6.5–8.1)

## 2019-10-27 LAB — RENAL FUNCTION PANEL
Albumin: 2.4 g/dL — ABNORMAL LOW (ref 3.5–5.0)
Anion gap: 15 (ref 5–15)
BUN: 80 mg/dL — ABNORMAL HIGH (ref 6–20)
CO2: 23 mmol/L (ref 22–32)
Calcium: 6.9 mg/dL — ABNORMAL LOW (ref 8.9–10.3)
Chloride: 107 mmol/L (ref 98–111)
Creatinine, Ser: 6.08 mg/dL — ABNORMAL HIGH (ref 0.61–1.24)
GFR calc Af Amer: 11 mL/min — ABNORMAL LOW (ref >60)
GFR calc non Af Amer: 9 mL/min — ABNORMAL LOW (ref >60)
Glucose, Bld: 256 mg/dL — ABNORMAL HIGH (ref 70–99)
Phosphorus: 10.6 mg/dL — ABNORMAL HIGH (ref 2.5–4.6)
Potassium: 7.1 mmol/L (ref 3.5–5.1)
Sodium: 145 mmol/L (ref 135–145)

## 2019-10-27 LAB — GLUCOSE, CAPILLARY
Glucose-Capillary: 191 mg/dL — ABNORMAL HIGH (ref 70–99)
Glucose-Capillary: 205 mg/dL — ABNORMAL HIGH (ref 70–99)
Glucose-Capillary: 221 mg/dL — ABNORMAL HIGH (ref 70–99)
Glucose-Capillary: 232 mg/dL — ABNORMAL HIGH (ref 70–99)
Glucose-Capillary: 208 mg/dL — ABNORMAL HIGH (ref 70–99)
Glucose-Capillary: 225 mg/dL — ABNORMAL HIGH (ref 70–99)

## 2019-10-27 LAB — CBC WITH DIFFERENTIAL/PLATELET
Abs Immature Granulocytes: 0.86 10*3/uL — ABNORMAL HIGH (ref 0.00–0.07)
Basophils Absolute: 0.1 10*3/uL (ref 0.0–0.1)
Basophils Relative: 1 %
Eosinophils Absolute: 0 10*3/uL (ref 0.0–0.5)
Eosinophils Relative: 0 %
HCT: 38 % — ABNORMAL LOW (ref 39.0–52.0)
Hemoglobin: 13 g/dL (ref 13.0–17.0)
Immature Granulocytes: 4 %
Lymphocytes Relative: 5 %
Lymphs Abs: 1 10*3/uL (ref 0.7–4.0)
MCH: 26.7 pg (ref 26.0–34.0)
MCHC: 34.2 g/dL (ref 30.0–36.0)
MCV: 78 fL — ABNORMAL LOW (ref 80.0–100.0)
Monocytes Absolute: 1.4 10*3/uL — ABNORMAL HIGH (ref 0.1–1.0)
Monocytes Relative: 7 %
Neutro Abs: 18.1 10*3/uL — ABNORMAL HIGH (ref 1.7–7.7)
Neutrophils Relative %: 83 %
Platelets: 251 10*3/uL (ref 150–400)
RBC: 4.87 MIL/uL (ref 4.22–5.81)
RDW: 15.7 % — ABNORMAL HIGH (ref 11.5–15.5)
Smear Review: NORMAL
WBC: 21.5 10*3/uL — ABNORMAL HIGH (ref 4.0–10.5)
nRBC: 1.3 % — ABNORMAL HIGH (ref 0.0–0.2)

## 2019-10-27 LAB — PROTIME-INR
INR: 9.4 (ref 0.8–1.2)
Prothrombin Time: 73.4 seconds — ABNORMAL HIGH (ref 11.4–15.2)

## 2019-10-27 LAB — TRIGLYCERIDES: Triglycerides: 181 mg/dL — ABNORMAL HIGH (ref <150)

## 2019-10-27 LAB — CULTURE, BLOOD (ROUTINE X 2)
Culture: NO GROWTH
Culture: NO GROWTH
Special Requests: ADEQUATE

## 2019-10-27 LAB — FIBRIN DERIVATIVES D-DIMER (ARMC ONLY): Fibrin derivatives D-dimer (ARMC): >7500 ng/mL (FEU) — ABNORMAL HIGH (ref 0.00–499.00)

## 2019-10-27 LAB — MAGNESIUM: Magnesium: 3.3 mg/dL — ABNORMAL HIGH (ref 1.7–2.4)

## 2019-10-27 LAB — PHOSPHORUS: Phosphorus: 9 mg/dL — ABNORMAL HIGH (ref 2.5–4.6)

## 2019-10-27 LAB — C-REACTIVE PROTEIN: CRP: 26.3 mg/dL — ABNORMAL HIGH (ref <1.0)

## 2019-10-27 LAB — FERRITIN: Ferritin: 1863 ng/mL — ABNORMAL HIGH (ref 24–336)

## 2019-10-27 LAB — CK: Total CK: 1935 U/L — ABNORMAL HIGH (ref 49–397)

## 2019-10-27 MED ORDER — INSULIN REGULAR HUMAN 100 UNIT/ML IJ SOLN
10.0000 [IU] | Freq: Once | INTRAMUSCULAR | Status: AC
  Administered 2019-10-27: 10 [IU] via INTRAVENOUS
  Filled 2019-10-27: qty 10

## 2019-10-27 MED ORDER — DEXTROSE 50 % IV SOLN
1.0000 | Freq: Once | INTRAVENOUS | Status: AC
  Administered 2019-10-27: 50 mL via INTRAVENOUS
  Filled 2019-10-27: qty 50

## 2019-10-27 MED ORDER — PRISMASOL BGK 0/2.5 32-2.5 MEQ/L IV SOLN
INTRAVENOUS | Status: DC
  Filled 2019-10-27: qty 5000

## 2019-10-27 MED ORDER — INSULIN GLARGINE 100 UNIT/ML ~~LOC~~ SOLN
30.0000 [IU] | Freq: Two times a day (BID) | SUBCUTANEOUS | Status: DC
  Administered 2019-10-28: 30 [IU] via SUBCUTANEOUS
  Filled 2019-10-27 (×3): qty 0.3

## 2019-10-27 MED ORDER — PANTOPRAZOLE SODIUM 40 MG IV SOLR
40.0000 mg | INTRAVENOUS | Status: DC
  Administered 2019-10-27 – 2019-11-02 (×7): 40 mg via INTRAVENOUS
  Filled 2019-10-27 (×7): qty 40

## 2019-10-27 MED ORDER — CHLORHEXIDINE GLUCONATE 0.12% ORAL RINSE (MEDLINE KIT)
15.0000 mL | Freq: Two times a day (BID) | OROMUCOSAL | Status: DC
  Administered 2019-10-27 – 2019-11-02 (×12): 15 mL via OROMUCOSAL

## 2019-10-27 MED ORDER — SODIUM CHLORIDE 0.9 % FOR CRRT
INTRAVENOUS_CENTRAL | Status: DC | PRN
  Filled 2019-10-27: qty 1000

## 2019-10-27 MED ORDER — VITAMIN K1 10 MG/ML IJ SOLN
10.0000 mg | Freq: Once | INTRAVENOUS | Status: AC
  Administered 2019-10-27: 10 mg via INTRAVENOUS
  Filled 2019-10-27: qty 1

## 2019-10-27 MED ORDER — HEPARIN SODIUM (PORCINE) 1000 UNIT/ML DIALYSIS
1000.0000 [IU] | INTRAMUSCULAR | Status: DC | PRN
  Administered 2019-10-28 – 2019-10-30 (×2): 2600 [IU] via INTRAVENOUS_CENTRAL
  Administered 2019-10-30: 1000 [IU] via INTRAVENOUS_CENTRAL
  Filled 2019-10-27 (×3): qty 3
  Filled 2019-10-27 (×2): qty 6
  Filled 2019-10-27: qty 3
  Filled 2019-10-27: qty 6
  Filled 2019-10-27 (×2): qty 3
  Filled 2019-10-27: qty 6

## 2019-10-27 MED ORDER — ORAL CARE MOUTH RINSE
15.0000 mL | OROMUCOSAL | Status: DC
  Administered 2019-10-27 – 2019-11-02 (×59): 15 mL via OROMUCOSAL

## 2019-10-27 NOTE — Consult Note (Signed)
[address] [place], [place] [address] Phone [phone]. Fax [phone]  Date: 10/27/2019                  Patient Name:  Spencer Woodward  MRN: [id number]  DOB: May 10, 1960  Age / Sex: 59 y.o., male         PCP: Revelo, Presley Raddle, MD                 Service Requesting Consult: IM/ Erin Fulling, MD                 Reason for Consult: ARF            History of Present Illness: Patient is a 59 y.o. male with medical problems of DM,HTN , Obesity, who was admitted to [facility] on 2019-11-06 for evaluation of SOB, generalized malaise for 2 days prior to the presentation, got diagnosed with COVID pneumonia.Initially patient was placed on BIPAP, but due to severe ARDS, required intubation and mechanical ventilation since 10/26/19. Patient deteriorated with multiorgan failure including renal failure, thus consulted Nephrology today.   Patient sedated with fentanyl and Versed  Medications: Outpatient medications: Medications Prior to Admission  Medication Sig Dispense Refill Last Dose  . amLODipine (NORVASC) 10 MG tablet Take 10 mg by mouth daily.   2019/11/06 at Unknown time  . atorvastatin (LIPITOR) 80 MG tablet Take 80 mg by mouth daily.   06-Nov-2019 at Unknown time  . azithromycin (ZITHROMAX) 250 MG tablet Take by mouth.   06-Nov-2019 at Unknown time  . colchicine 0.6 MG tablet Take 0.6 mg by mouth daily.   PRN at PRN  . empagliflozin (JARDIANCE) 25 MG TABS tablet Take 25 mg by mouth daily.   11/06/2019 at Unknown time  . glipiZIDE (GLUCOTROL XL) 10 MG 24 hr tablet Take 10 mg by mouth daily with breakfast.   06-Nov-2019 at Unknown time  . Insulin Glargine-Lixisenatide (SOLIQUA) 100-33 UNT-MCG/ML SOPN Inject 60 Units into the skin daily.    Past Week at Unknown time  . lisinopril (PRINIVIL,ZESTRIL) 40 MG tablet Take 40 mg by mouth daily.   11-06-19 at Unknown time  . metFORMIN (GLUCOPHAGE) 1000 MG tablet Take 1 tablet (1,000 mg total) by mouth 2 (two) times daily with a meal. 180  tablet 1 November 06, 2019 at Unknown time  . Multiple Vitamin (MULTIVITAMIN) tablet Take 1 tablet by mouth daily.   11/06/19 at Unknown time  . PROAIR HFA 108 (90 Base) MCG/ACT inhaler Inhale 2 puffs into the lungs every 4 (four) hours as needed.   Past Week at PRN  . sildenafil (VIAGRA) 100 MG tablet Take 100 mg by mouth daily as needed for erectile dysfunction.   Past Month at PRN  . warfarin (COUMADIN) 1 MG tablet Take 1 mg by mouth daily.   11-06-2019 at Unknown time  . warfarin (COUMADIN) 10 MG tablet Take 10 mg by mouth daily.   Nov 06, 2019 at Unknown time    Current medications: Current Facility-Administered Medications  Medication Dose Route Frequency Provider Last Rate Last Admin  . acetaminophen (TYLENOL) suppository 650 mg  650 mg Rectal Q6H PRN Eugenie Norrie, NP   650 mg at 10/26/19 2153  . albuterol (VENTOLIN HFA) 108 (90 Base) MCG/ACT inhaler 2 puff  2 puff Inhalation Q6H Andris Baumann, MD   2 puff at 10/25/19 1339  . amLODipine (NORVASC) tablet 10 mg  10 mg Oral Daily Tukov-Yual, Magdalene S, NP   10 mg at 10/24/19 6962  . ascorbic  acid (VITAMIN C) tablet 500 mg  500 mg Oral Daily Arnetha CourserAmin, Sumayya, MD   500 mg at 10/24/19 [id number]  . atorvastatin (LIPITOR) tablet 80 mg  80 mg Oral Daily Tukov-Yual, Magdalene S, NP   80 mg at 10/24/19 [id number]  . azithromycin (ZITHROMAX) 500 mg in sodium chloride 0.9 % 250 mL IVPB  500 mg Intravenous Q24H Willy Eddyobinson, Patrick, MD   Stopped at 10/26/19 1735  . baricitinib (OLUMIANT) tablet 4 mg  4 mg Oral Daily Aleda GranaZeigler, Dustin G, RPH   4 mg at 10/24/19 1128  . cefTRIAXone (ROCEPHIN) 2 g in sodium chloride 0.9 % 100 mL IVPB  2 g Intravenous Q24H Willy Eddyobinson, Patrick, MD   Stopped at 10/26/19 1833  . chlorhexidine (PERIDEX) 0.12 % solution 15 mL  15 mL Mouth Rinse BID Manuela SchwartzMorrison, Brenda, NP   15 mL at 10/27/19 1123  . Chlorhexidine Gluconate Cloth 2 % PADS 6 each  6 each Topical Daily Manuela SchwartzMorrison, Brenda, NP   6 each at 10/27/19 1129  . chlorpheniramine-HYDROcodone (TUSSIONEX)  10-8 MG/5ML suspension 5 mL  5 mL Oral Q12H PRN Lindajo Royaluncan, Hazel V, MD      . colchicine tablet 0.6 mg  0.6 mg Oral Daily Tukov-Yual, Magdalene S, NP   0.6 mg at 10/24/19 1128  . dexamethasone (DECADRON) injection 6 mg  6 mg Intravenous Q24H Andris Baumannuncan, Hazel V, MD   6 mg at 10/26/19 1806  . dexmedetomidine (PRECEDEX) 400 MCG/100ML (4 mcg/mL) infusion  0.4-1.2 mcg/kg/hr Intravenous Titrated Erin FullingKasa, Kurian, MD   Stopped at 10/26/19 1124  . docusate (COLACE) 50 MG/5ML liquid 100 mg  100 mg Oral BID Erin FullingKasa, Kurian, MD      . fentaNYL 2500mcg in NS 250mL (1410mcg/ml) infusion-PREMIX  0-400 mcg/hr Intravenous Continuous Erin FullingKasa, Kurian, MD 40 mL/hr at 10/27/19 1116 400 mcg/hr at 10/27/19 1116  . guaiFENesin-dextromethorphan (ROBITUSSIN DM) 100-10 MG/5ML syrup 10 mL  10 mL Oral Q4H PRN Lindajo Royaluncan, Hazel V, MD      . insulin aspart (novoLOG) injection 0-20 Units  0-20 Units Subcutaneous Q4H Tukov-Yual, Magdalene S, NP   7 Units at 10/27/19 1121  . insulin glargine (LANTUS) injection 65 Units  65 Units Subcutaneous Daily Erin FullingKasa, Kurian, MD   65 Units at 10/27/19 1128  . Ipratropium-Albuterol (COMBIVENT) respimat 1 puff  1 puff Inhalation Q6H Tukov-Yual, Magdalene S, NP   1 puff at 10/25/19 1339  . linagliptin (TRADJENTA) tablet 5 mg  5 mg Oral Daily Andris Baumannuncan, Hazel V, MD   5 mg at 10/28/2019 2125  . LORazepam (ATIVAN) injection 0.5-1 mg  0.5-1 mg Intravenous Q4H PRN Eugenie NorrieBlakeney, Dana G, NP   1 mg at 10/27/19 0803  . MEDLINE mouth rinse  15 mL Mouth Rinse q12n4p Manuela SchwartzMorrison, Brenda, NP   15 mL at 10/27/19 1129  . midazolam (VERSED) 50 mg/50 mL (1 mg/mL) premix infusion  0-10 mg/hr Intravenous Continuous Erin FullingKasa, Kurian, MD 10 mL/hr at 10/27/19 0841 10 mg/hr at 10/27/19 0841  . midazolam (VERSED) bolus via infusion 1 mg  1 mg Intravenous Q2H PRN Erin FullingKasa, Kurian, MD   1 mg at 10/27/19 1111  . multivitamin with minerals tablet 1 tablet  1 tablet Oral Daily Andris Baumannuncan, Hazel V, MD   1 tablet at 10/24/19 [id number]  . norepinephrine (LEVOPHED) 16 mg in 250mL  premix infusion  0-40 mcg/min Intravenous Titrated Harlon DittyKeene, Jeremiah D, NP 23.4 mL/hr at 10/27/19 0841 25 mcg/min at 10/27/19 0841  . ondansetron (ZOFRAN) tablet 4 mg  4 mg Oral Q6H PRN Andris Baumannuncan, Hazel V, MD  Or  . ondansetron (ZOFRAN) injection 4 mg  4 mg Intravenous Q6H PRN Andris Baumann, MD      . polyethylene glycol (MIRALAX / GLYCOLAX) packet 17 g  17 g Oral Daily Kasa, Kurian, MD      . propofol (DIPRIVAN) 1000 MG/100ML infusion  5-80 mcg/kg/min Intravenous Titrated Erin Fulling, MD   Stopped at 10/26/19 1637  . sodium chloride flush (NS) 0.9 % injection 10-40 mL  10-40 mL Intracatheter Q12H Erin Fulling, MD   30 mL at 10/27/19 0911  . sodium chloride flush (NS) 0.9 % injection 10-40 mL  10-40 mL Intracatheter PRN Erin Fulling, MD      . thiamine (B-1) injection 100 mg  100 mg Intravenous Daily Tukov-Yual, Magdalene S, NP   100 mg at 10/27/19 0804  . vasopressin (PITRESSIN) 20 Units in sodium chloride 0.9 % 100 mL infusion-*FOR SHOCK*  0-0.03 Units/min Intravenous Continuous Harlon Ditty D, NP 9 mL/hr at 10/27/19 0841 0.03 Units/min at 10/27/19 0841  . vecuronium (NORCURON) injection 10-20 mg  10-20 mg Intravenous Q1H PRN Erin Fulling, MD   20 mg at 10/27/19 1117  . Warfarin - Pharmacist Dosing Inpatient   Does not apply q1600 Arnetha Courser, MD      . zinc sulfate capsule 220 mg  220 mg Oral Daily Andris Baumann, MD   220 mg at 10/24/19 1610      Allergies: No Known Allergies    Past Medical History: Past Medical History:  Diagnosis Date  . Diabetes mellitus without complication [facility])      Past Surgical History: Past Surgical History:  Procedure Laterality Date  . TONSILECTOMY, ADENOIDECTOMY, BILATERAL MYRINGOTOMY AND TUBES       Family History: Family History  Problem Relation Age of Onset  . Diabetes Mother   . Heart disease Father   . Alcohol abuse Father   . Prostate cancer Neg Hx   . Bladder Cancer Neg Hx   . Kidney cancer Neg Hx      Social  History: Social History   Socioeconomic History  . Marital status: Married    Spouse name: Not on file  . Number of children: Not on file  . Years of education: Not on file  . Highest education level: Not on file  Occupational History  . Not on file  Tobacco Use  . Smoking status: Never Smoker  . Smokeless tobacco: Never Used  Substance and Sexual Activity  . Alcohol use: No    Comment: social  . Drug use: No  . Sexual activity: Not on file  Other Topics Concern  . Not on file  Social History Narrative  . Not on file   Social Determinants of Health   Financial Resource Strain:   . Difficulty of Paying Living Expenses: Not on file  Food Insecurity:   . Worried About Programme researcher, broadcasting/film/video in the Last Year: Not on file  . Ran Out of Food in the Last Year: Not on file  Transportation Needs:   . Lack of Transportation (Medical): Not on file  . Lack of Transportation (Non-Medical): Not on file  Physical Activity:   . Days of Exercise per Week: Not on file  . Minutes of Exercise per Session: Not on file  Stress:   . Feeling of Stress : Not on file  Social Connections:   . Frequency of Communication with Friends and Family: Not on file  . Frequency of Social Gatherings with Friends and Family: Not on  file  . Attends Religious Services: Not on file  . Active Member of Clubs or Organizations: Not on file  . Attends Banker Meetings: Not on file  . Marital Status: Not on file  Intimate Partner Violence:   . Fear of Current or Ex-Partner: Not on file  . Emotionally Abused: Not on file  . Physically Abused: Not on file  . Sexually Abused: Not on file     Review of Systems: Unable to obtain as patient is critically ill,intubated, and sedated. Gen:  HEENT:  CV:  Resp:  GI: GU :  MS:  Derm:    Psych: Heme:  Neuro:  Endocrine  Vital Signs: Blood pressure 106/71, pulse (!) 123, temperature (!) 100.4 F (38 C), temperature source Axillary, resp. rate (!)  30, height 6\' 3"  (1.905 m), weight (!) 147.3 kg, SpO2 96 %.   Intake/Output Summary (Last 24 hours) at 10/27/2019 1240 Last data filed at 10/27/2019 0911 Gross per 24 hour  Intake 2011.72 ml  Output 1055 ml  Net 956.72 ml    Weight trends: Filed Weights   11/05/2019 1658 10/25/19 0527  Weight: (!) 149.7 kg (!) 147.3 kg    Physical Exam: General:  Critically ill apeearing,intubated and sedated  HEENT Normocephalic,Atraumatic,Sclera -non-icteric.Oral mucous membranes moist,  ETT in place  Lungs: Lung sounds diminished bilaterally, dependent on mechanical ventilation with FIO2 60%  Heart::  Regular, tachycardic with HR in 120's on heart monitor  Abdomen: Soft,non distended  Extremities:  No edema,cyanosis or erythema noted  Neurologic: Sedated   Skin: Warm, dry, no rashes or lesions noted  Access: To be established  Foley: Oliguric with minimal dark tea colored urine output from the F/C       Lab results: Basic Metabolic Panel: Recent Labs  Lab 10/25/19 1011 10/26/19 0514 10/27/19 0555  NA 137 144 146*  K 4.0 4.6 6.6*  CL 102 106 109  CO2 25 26 23   GLUCOSE 231* 182* 242*  BUN 27* 32* 66*  CREATININE 0.79 1.02 4.12*  CALCIUM 7.7* 8.0* 7.3*  MG 2.6* 2.9* 3.3*  PHOS 3.0 3.7 9.0*    Liver Function Tests: Recent Labs  Lab 10/27/19 0555  AST 156*  ALT 148*  ALKPHOS 135*  BILITOT 2.0*  PROT 6.8  ALBUMIN 2.6*   No results for input(s): LIPASE, AMYLASE in the last 168 hours. No results for input(s): AMMONIA in the last 168 hours.  CBC: Recent Labs  Lab 10/26/19 0514 10/27/19 0555  WBC 21.6* 21.5*  NEUTROABS 19.0* 18.1*  HGB 13.4 13.0  HCT 36.1* 38.0*  MCV 73.5* 78.0*  PLT 214 251    Cardiac Enzymes: No results for input(s): CKTOTAL, TROPONINI in the last 168 hours.  BNP: Invalid input(s): POCBNP  CBG: Recent Labs  Lab 10/26/19 1940 10/26/19 2347 10/27/19 0351 10/27/19 0758 10/27/19 1057  GLUCAP 184* 175* 191* 205* 221*     Microbiology: Recent Results (from the past 720 hour(s))  Blood Culture (routine x 2)     Status: None   Collection Time: 10/27/2019  4:55 PM   Specimen: BLOOD  Result Value Ref Range Status   Specimen Description BLOOD BLOOD LEFT FOREARM  Final   Special Requests   Final    BOTTLES DRAWN AEROBIC AND ANAEROBIC Blood Culture results may not be optimal due to an excessive volume of blood received in culture bottles   Culture   Final    NO GROWTH 5 DAYS Performed at [facility] [address], [place], [place] [address]    Report Status 10/27/2019 FINAL  Final  SARS Coronavirus 2 by RT PCR (hospital order, performed in [facility] hospital lab) Nasopharyngeal Nasopharyngeal Swab     Status: Abnormal   Collection Time: 10/18/2019  5:03 PM   Specimen: Nasopharyngeal Swab  Result Value Ref Range Status   SARS Coronavirus 2 POSITIVE (A) NEGATIVE Final    Comment: RESULT CALLED TO, READ BACK BY AND VERIFIED WITH: APRIL BRUMGARD 10/28/2019 AT 2027 HS (NOTE) SARS-CoV-2 target nucleic acids are DETECTED  SARS-CoV-2 RNA is generally detectable in upper respiratory specimens  during the acute phase of infection.  Positive results are indicative  of the presence of the identified virus, but do not rule out bacterial infection or co-infection with other pathogens not detected by the test.  Clinical correlation with patient history and  other diagnostic information is necessary to determine patient infection status.  The expected result is negative.  Fact Sheet for Patients:   BoilerBrush.com.cy   Fact Sheet for Healthcare Providers:   https://pope.com/    This test is not yet approved or cleared by the [place] FDA and  has been authorized for detection and/or diagnosis of SARS-CoV-2 by FDA under an Emergency Use Authorization (EUA).  This EUA will remain in effect (meaning this tes t can be used) for the duration of  the  COVID-19 declaration under Section 564(b)(1) of the Act, 21 U.S.C. section 360-bbb-3(b)(1), unless the authorization is terminated or revoked sooner.  Performed at [facility], [address] [place], [place] [address]   Blood Culture (routine x 2)     Status: None   Collection Time: 10/29/2019  6:01 PM   Specimen: BLOOD  Result Value Ref Range Status   Specimen Description BLOOD RH  Final   Special Requests   Final    BOTTLES DRAWN AEROBIC AND ANAEROBIC Blood Culture adequate volume   Culture   Final    NO GROWTH 5 DAYS Performed at [facility], [address] [place], [place] [address]    Report Status 10/27/2019 FINAL  Final  MRSA PCR Screening     Status: None   Collection Time: 10/23/19  7:43 AM   Specimen: Nasal Mucosa; Nasopharyngeal  Result Value Ref Range Status   MRSA by PCR NEGATIVE NEGATIVE Final    Comment:        The GeneXpert MRSA Assay (FDA approved for NASAL specimens only), is one component of a comprehensive MRSA colonization surveillance program. It is not intended to diagnose MRSA infection nor to guide or monitor treatment for MRSA infections. Performed at [facility], [address] [place], [place] [address]      Coagulation Studies: Recent Labs    10/25/19 1011 10/26/19 0514 10/27/19 0555  LABPROT 55.8* 60.7* 73.4*  INR 6.6* 7.3* 9.4*    Urinalysis: No results for input(s): COLORURINE, LABSPEC, PHURINE, GLUCOSEU, HGBUR, BILIRUBINUR, KETONESUR, PROTEINUR, UROBILINOGEN, NITRITE, LEUKOCYTESUR in the last 72 hours.  Invalid input(s): APPERANCEUR      Imaging: DG Chest Port 1 View  Result Date: 10/27/2019 CLINICAL DATA:  Acute respiratory failure. EXAM: PORTABLE CHEST 1 VIEW COMPARISON:  10/23/2019.  CT 10/19/2019. FINDINGS: Tracheostomy tube noted with tip 4 cm above the carina. PICC line noted with tip over SVC. Heart size normal. Bilateral multifocal pulmonary infiltrates again noted. Interim  improvement in aeration from prior exam. No pleural effusion or pneumothorax. Degenerative change thoracic spine. IMPRESSION: 1. Tracheostomy tube noted with tip 4  cm above the carina. PICC line noted with tip over SVC. 2. Bilateral multifocal pulmonary infiltrates again noted. Interim improvement in aeration from prior exam. Electronically Signed   By: Maisie Fus  Register   On: 10/27/2019 05:12   [place] EKG SITE RITE  Result Date: 10/25/2019 If Site Rite image not attached, placement could not be confirmed due to current cardiac rhythm.     Assessment & Plan: Pt is a 59 y.o. African-American  male with Medical problems of diabetes hypertension and obesity , was admitted on 10/24/2019 with Acute respiratory failure with hypoxia (HCC) [J96.01] Acute respiratory failure due to COVID-19 (HCC) [U07.1, J96.00] Pneumonia due to COVID-19 virus [U07.1, J12.82] , was on non invasive ventilation initially, but required mechanical ventilation on 10/26/19 due to severe ARDS. Currently on FIO2 60%.   # Acute renal failure Patient sustained multiorgan failure, including renal failure. Oliguric with very minimal dark tea colored urine draining via F/C.  Plan CRRT today, obtained consent from his wife over the phone. Access catheter to be established by  ICU team  #Acute respiratory failure, secondary to COVID-19 pneumonia Currently ventilator dependent FiO2 60% Management as per ICU team  #Hyperkalemia Will use low potassium bath for CRRT  #Severe hyperphosphatemia Rhabdomyolysis suspected We will check CK level with next lab draw   #Shock liver INR elevated to 1.4 Albumin low at 2.6, elevated AST, ALT  With multiple severe comorbidities, overall prognosis appears poor   LOS: 5 Deeric Cruise 8/19/202112:40 PM    Note: This note was prepared with Dragon dictation. Any transcription errors are unintentional

## 2019-10-27 NOTE — Progress Notes (Signed)
CRITICAL CARE NOTE 59 year old male with PMH significant for uncontrolled type 2 DM, HLD, HTN, Thyroid nodule, gout, morbid obesity and pulmonary embolism presented to [facility] ER on 08/14 via EMS from home with generalized malaise, shortness of breath and chest discomfort with inspiration. Admitted with acute hypoxic respiratory failure secondary to COVID-19 Pneumonia. On high flow O2 alternating with BiPAP  SIGNIFICANT EVENTS 08/14: Pt admitted tostepdown unit with COVID pneumonia 08/16:Remains housed in EDP due to no rooms. Still stepdown status, remains BiPAP dependent 8/17 remains on biPAP, on precedex 8/18 Intubated, sedated, severe ARDS 8/19 multiorgan failure, progressive hypoxia and shock a with renal failure   STUDIES: 8/14:Chest XrayshowsInterval development of patchy bilateral airspace opacities which may represent pneumonia 8/14: CTA Chest Slightly suboptimal opacification of the main pulmonary artery, however no central or proximal segmental pulmonary embolism.Extensive multifocal airspace opacities, consistent with atypical viral pneumonia  CULTURES: 8/14:BCxpending  ANTIBIOTICS: Ceftriaxone 8/14> Azithromycin8/14>  CC  follow up respiratory failure  SUBJECTIVE Patient remains critically ill Prognosis is guarded  Vent Mode: PRVC FiO2 (%):  [60 %-100 %] 60 % Set Rate:  [20 bmp-30 bmp] 30 bmp Vt Set:  [550 mL] 550 mL PEEP:  [20 cmH20] 20 cmH20 Plateau Pressure:  [32 cmH20-36 cmH20] 36 cmH20  BP 106/71   Pulse (!) 123   Temp (!) 100.4 F (38 C) (Axillary)   Resp (!) 30   Ht 6\' 3"  (1.905 m)   Wt (!) 147.3 kg   SpO2 96%   BMI 40.59 kg/m    I/O last 3 completed shifts: In: 3515 [I.V.:2446.3; IV Piggyback:1068.7] Out: 2600 [Urine:2600] Total I/O In: 237.7 [I.V.:237.7] Out: 5 [Urine:5]  SpO2: 96 % O2 Flow Rate (L/min): 50 L/min FiO2 (%): 60 %  Estimated body mass index is 40.59 kg/m as calculated from the following:   Height as of this  encounter: 6\' 3"  (1.905 m).   Weight as of this encounter: 147.3 kg.  SIGNIFICANT EVENTS   REVIEW OF SYSTEMS  PATIENT IS UNABLE TO PROVIDE COMPLETE REVIEW OF SYSTEMS DUE TO SEVERE CRITICAL ILLNESS        PHYSICAL EXAMINATION:  GENERAL:critically ill appearing, +resp distress HEAD: Normocephalic, atraumatic.  EYES: Pupils equal, round, reactive to light.  No scleral icterus.  MOUTH: Moist mucosal membrane. NECK: Supple.  PULMONARY: +rhonchi, +wheezing CARDIOVASCULAR: S1 and S2. Regular rate and rhythm. No murmurs, rubs, or gallops.  GASTROINTESTINAL: Soft, nontender, -distended.  Positive bowel sounds.   MUSCULOSKELETAL: No swelling, clubbing, or edema.  NEUROLOGIC: obtunded, GCS<8 SKIN:intact,warm,dry  MEDICATIONS: I have reviewed all medications and confirmed regimen as documented   CULTURE RESULTS   Recent Results (from the past 240 hour(s))  Blood Culture (routine x 2)     Status: None   Collection Time: 10/24/2019  4:55 PM   Specimen: BLOOD  Result Value Ref Range Status   Specimen Description BLOOD BLOOD LEFT FOREARM  Final   Special Requests   Final    BOTTLES DRAWN AEROBIC AND ANAEROBIC Blood Culture results may not be optimal due to an excessive volume of blood received in culture bottles   Culture   Final    NO GROWTH 5 DAYS Performed at [facility], [address] [place], [address] [place]    Report Status 10/27/2019 FINAL  Final  SARS Coronavirus 2 by RT PCR (hospital order, performed in [facility] hospital lab) Nasopharyngeal Nasopharyngeal Swab     Status: Abnormal   Collection Time: 11/06/2019  5:03 PM   Specimen: Nasopharyngeal Swab  Result Value Ref Range Status   SARS Coronavirus 2 POSITIVE (A) NEGATIVE Final    Comment: RESULT CALLED TO, READ BACK BY AND VERIFIED WITH: APRIL BRUMGARD 10/16/2019 AT 2027 HS (NOTE) SARS-CoV-2 target nucleic acids are DETECTED  SARS-CoV-2 RNA is generally detectable in upper respiratory specimens  during  the acute phase of infection.  Positive results are indicative  of the presence of the identified virus, but do not rule out bacterial infection or co-infection with other pathogens not detected by the test.  Clinical correlation with patient history and  other diagnostic information is necessary to determine patient infection status.  The expected result is negative.  Fact Sheet for Patients:   BoilerBrush.com.cy   Fact Sheet for Healthcare Providers:   https://pope.com/    This test is not yet approved or cleared by the [place] FDA and  has been authorized for detection and/or diagnosis of SARS-CoV-2 by FDA under an Emergency Use Authorization (EUA).  This EUA will remain in effect (meaning this tes t can be used) for the duration of  the COVID-19 declaration under Section 564(b)(1) of the Act, 21 U.S.C. section 360-bbb-3(b)(1), unless the authorization is terminated or revoked sooner.  Performed at [facility], [address] [place], [place] [address]   Blood Culture (routine x 2)     Status: None   Collection Time: 10/09/2019  6:01 PM   Specimen: BLOOD  Result Value Ref Range Status   Specimen Description BLOOD RH  Final   Special Requests   Final    BOTTLES DRAWN AEROBIC AND ANAEROBIC Blood Culture adequate volume   Culture   Final    NO GROWTH 5 DAYS Performed at [facility], [address] [place], [place] [address]    Report Status 10/27/2019 FINAL  Final  MRSA PCR Screening     Status: None   Collection Time: 10/23/19  7:43 AM   Specimen: Nasal Mucosa; Nasopharyngeal  Result Value Ref Range Status   MRSA by PCR NEGATIVE NEGATIVE Final    Comment:        The GeneXpert MRSA Assay (FDA approved for NASAL specimens only), is one component of a comprehensive MRSA colonization surveillance program. It is not intended to diagnose MRSA infection nor to guide or monitor treatment for MRSA  infections. Performed at [facility], [address] [place], [place] [address]           IMAGING    DG Chest Port 1 View  Result Date: 10/27/2019 CLINICAL DATA:  Acute respiratory failure. EXAM: PORTABLE CHEST 1 VIEW COMPARISON:  11/03/2019.  CT 10/15/2019. FINDINGS: Tracheostomy tube noted with tip 4 cm above the carina. PICC line noted with tip over SVC. Heart size normal. Bilateral multifocal pulmonary infiltrates again noted. Interim improvement in aeration from prior exam. No pleural effusion or pneumothorax. Degenerative change thoracic spine. IMPRESSION: 1. Tracheostomy tube noted with tip 4 cm above the carina. PICC line noted with tip over SVC. 2. Bilateral multifocal pulmonary infiltrates again noted. Interim improvement in aeration from prior exam. Electronically Signed   By: Maisie Fus  Register   On: 10/27/2019 05:12     Nutrition Status:           Indwelling Urinary Catheter continued, requirement due to   Reason to continue Indwelling Urinary Catheter strict Intake/Output monitoring for hemodynamic instability   Central Line/ continued, requirement due to  Reason to continue [employer] Monitoring of central venous pressure or other hemodynamic parameters and poor  IV access   Ventilator continued, requirement due to severe respiratory failure   Ventilator Sedation RASS 0 to -2      ASSESSMENT AND PLAN SYNOPSIS  Acute hypoxemic respiratory failure due to COVID-19 pneumonia / ARDS Mechanical ventilation via ARDS protocol, target PRVC 6 cc/kg Wean PEEP and FiO2 as able Goal plateau pressure less than 30, driving pressure less than 15 Paralytics if necessary for vent synchrony, gas exchange Cycle prone positioning if necessary for oxygenation Deep sedation per PAD protocol, goal RASS -4, currently fentanyl, midazolam Diuresis as blood pressure and renal function can tolerate, goal CVP 5-8.   diuresis as tolerated based on Kidney function VAP  prevention order set Remdesivir Follow inflammatory markers: Ferritin, D-dimer, CRP, IL-6, LDH Assess for possible tocilizumab  Vitamin C, zinc Plan to repeat and check resp cultures    Severe ACUTE Hypoxic and Hypercapnic Respiratory Failure -continue Full MV support -continue Bronchodilator Therapy -Wean Fio2 and PEEP as tolerated -VAP/VENT bundle implementation  ACUTE DIASTOLIC CARDIAC FAILURE  Morbid obesity, possible OSA.   Will certainly impact respiratory mechanics, ventilator weaning    ACUTE KIDNEY INJURY/Renal Failure -continue Foley Catheter-assess need -Avoid nephrotoxic agents -Follow urine output, BMP -Ensure adequate renal perfusion, optimize oxygenation -Renal dose medications Plan for CRRT     NEUROLOGY - intubated and sedated - minimal sedation to achieve a RASS goal: -1 Wake up assessment pending  Acute toxic metabolic encephalopathy, need for sedation Goal RASS -2 to -3  SHOCK-SEPSIS/HYPOVOLUMIC/CARDIOGENIC -use vasopressors to keep MAP>65 -follow ABG and LA -follow up cultures -emperic ABX  CARDIAC ICU monitoring  ID -continue IV abx as prescibed -follow up cultures  GI GI PROPHYLAXIS as indicated  NUTRITIONAL STATUS Nutrition Status:         DIET-->NPO Constipation protocol as indicated  ENDO - will use ICU hypoglycemic\Hyperglycemia protocol if indicated     ELECTROLYTES -follow labs as needed -replace as needed -pharmacy consultation and following   DVT/GI PRX ordered and assessed TRANSFUSIONS AS NEEDED MONITOR FSBS I Assessed the need for Labs I Assessed the need for Foley I Assessed the need for Central Venous Line Family Discussion when available I Assessed the need for Mobilization I made an Assessment of medications to be adjusted accordingly Safety Risk assessment completed   CASE DISCUSSED IN MULTIDISCIPLINARY ROUNDS WITH ICU TEAM  Critical Care Time devoted to patient care services described in  this note is 34 minutes.   Overall, patient is critically ill, prognosis is guarded.  Patient with Multiorgan failure and at high risk for cardiac arrest and death.    Lucie Leather, M.D.  [facility]  Medical Director [facility] Medical Director [facility] Cardio-Pulmonary Department

## 2019-10-27 NOTE — Progress Notes (Signed)
Patient began CRRT this shift. Tolerating CRRT well as of this time.  Max temp this shift was 100.4 * F.  Patient remains on Versed and Fentanyl for sedation with PRN vecuronium and boluses of Versed.  Patient remains on levophed and vasopressin for blood pressure support.  Patient has excessive bleeding from mouth and gums from elevated INR. Frequent care and monitoring provided.

## 2019-10-27 NOTE — Progress Notes (Signed)
ANTICOAGULATION CONSULT NOTE  Pharmacy Consult for Warfarin Dosing Indication: hx of  DVT  No Known Allergies  Patient Measurements: Height: 6\' 3"  (190.5 cm) Weight: (!) 147.3 kg (324 lb 11.8 oz) IBW/kg (Calculated) : 84.5  Vital Signs: Temp: 100.4 F (38 C) (08/19 0800) Temp Source: Axillary (08/19 0800) BP: 106/71 (08/19 1000) Pulse Rate: 123 (08/19 1000)  Labs: Recent Labs    10/25/19 1011 10/25/19 1011 10/26/19 0514 10/27/19 0555  HGB 13.1   < > 13.4 13.0  HCT 35.9*  --  36.1* 38.0*  PLT 181  --  214 251  LABPROT 55.8*  --  60.7* 73.4*  INR 6.6*  --  7.3* 9.4*  CREATININE 0.79  --  1.02 4.12*   < > = values in this interval not displayed.    Estimated Creatinine Clearance: 30.7 mL/min (A) (by C-G formula based on SCr of 4.12 mg/dL (H)).  Medical History: Past Medical History:  Diagnosis Date  . Diabetes mellitus without complication [facility])     Assessment: 59 year old male with history of DM, HTN, and PE. Patient is COVID +. Patient on warfarin PTA for h/o PE several years ago. 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 He reports taking warfarin 11 mg daily.  Goal of Therapy:  INR 2-3 Monitor platelets by anticoagulation protocol: Yes   Date INR Dose  8/15 4.3 HOLD  8/16 4.6 HOLD  8/17 6.6 HOLD  8/18 7.3 HOLD  8/19 9.4 HOLD, vitK 10 mg       Plan:  Vitamin K 10 mg IV x 1 given today. Patient with significant decline in renal function. Starting on CRRT.  9/19, PharmD 10/27/2019 2:24 PM

## 2019-10-27 NOTE — Progress Notes (Signed)
CRRT stopped at 2100 due to increased filter pressures. Blood returned to the patient. New filter that was primed experienced error due to remaining air in lines. Awaiting new filter from supply to retry and restart CRRT.   Carmel Sacramento, RN

## 2019-10-27 NOTE — Progress Notes (Addendum)
CRRT was started at 1730, not the documented time of 1700 as per the CRRT flowsheet.

## 2019-10-27 NOTE — Progress Notes (Signed)
Assisted tele visit to patient with family member.  Sloka Volante D Jennfier Abdulla, RN   

## 2019-10-27 NOTE — Progress Notes (Signed)
Initial Nutrition Assessment  DOCUMENTATION CODES:   Morbid obesity  INTERVENTION:  Once enteral access able to be obtained and patient is stable enough for enteral nutrition recommend: -Pivot 1.5 Cal at 60 mL/hr + PROSource TF 90 mL BID per tube -Provides 2320 kcal, 179 grams of protein, 1080 mL H2O daily  NUTRITION DIAGNOSIS:   Inadequate oral intake related to inability to eat as evidenced by NPO status.  GOAL:   Patient will meet greater than or equal to 90% of their needs  MONITOR:   Vent status, Labs, Weight trends, TF tolerance, I & O's  REASON FOR ASSESSMENT:   Ventilator    ASSESSMENT:   59 year old male with PMHx of DM, HLD, HTN, thyroid nodule, gout, pulmonary embolism admitted with COVID-19 PNA.   8/18 intubated  Patient is currently intubated on ventilator support MV: 16.2 L/min Temp (24hrs), Avg:100.1 F (37.8 C), Min:98.8 F (37.1 C), Max:101.6 F (38.7 C)  Medications reviewed and include: Decadron 6 mg Q24hrs, Colace, Novolog 0-20 units Q4hrs, Lantus 30 units BID, Protonix, azithromycin, ceftriaxone, fentanyl gtt, Versed gtt, norepinephrine gtt at 30 mcg/min, vasopressin gtt.  Labs reviewed: CBG 205-232, Sodium 146, Potassium 6.6, BUN 66, Creatinine 4.12, Phosphorus 9, Magnesium 3.3.  I/O: 1350 mL UOP Yesterday (0.4 mL/kg/hr)  Weight trend: 147.3 kg on 8/17; limited wt history PTA  Discussed with MD and on rounds. Patient unable to have OGT placed after attempts by MD and RN. Bleeding during attempts. INR is 9.4 so plan is for vitamin K today. Plan is for CRRT.  NUTRITION - FOCUSED PHYSICAL EXAM:  Deferred.  Diet Order:   Diet Order            Diet NPO time specified  Diet effective now                EDUCATION NEEDS:   No education needs have been identified at this time  Skin:  Skin Assessment: Reviewed RN Assessment  Last BM:  Unknown  Height:   Ht Readings from Last 1 Encounters:  10/25/19 6\' 3"  (1.905 m)   Weight:    Wt Readings from Last 1 Encounters:  10/25/19 (!) 147.3 kg   Ideal Body Weight:  89.1 kg  BMI:  Body mass index is 40.59 kg/m.  Estimated Nutritional Needs:   Kcal:  2250  Protein:  180 grams  Fluid:  2.2 L/day  2251, MS, RD, LDN Pager number available on Amion

## 2019-10-27 NOTE — Progress Notes (Signed)
CRRT successfully restarted at 2300.  Carmel Sacramento, RN

## 2019-10-27 NOTE — Procedures (Signed)
Central Venous Dailysis Catheter Placement: TRIPLE LUMEN    Indication(s) Hemo Dialysis/CRRT  Consent verbal Risks of the procedure as well as the alternatives and risks of each were explained to the patient and/or caregiver.  Consent for the procedure was obtained and is signed in the bedside chart  Consent:emergent   Anesthesia Topical only with 1% lidocaine    Timeout Verified patient identification, verified procedure, site/side was marked, verified correct patient position, special equipment/implants available, medications/allergies/relevant history reviewed, required imaging and test results available. Patient comfort was obtained.     Sterile Technique Maximal sterile technique including full sterile barrier drape, hand hygiene, sterile gown, sterile gloves, mask, hair covering, sterile ultrasound probe cover (if used).   Hand washing performed prior to starting the procedure.    Procedure Description Area of catheter insertion was cleaned with chlorhexidine and draped in sterile fashion.  With real-time ultrasound guidance a central venous catheter was placed into the vein. Nonpulsatile blood flow and easy flushing noted in all ports.  The catheter was sutured in place and sterile dressing applied.   A triple lumen catheter was placed in LEFT Internal Jugular Vein There was good blood return, catheter caps were placed on lumens, catheter flushed easily, the line was secured and a sterile dressing and BIO-PATCH applied.   Ultrasound was used to visualize vasculature and guidance of needle.   Number of Attempts: 1 Complications:none  Estimated Blood Loss: none Operator: Manjit Bufano.   Lucie Leather, M.D.  [facility]  Medical Director [facility] Medical Director [facility] Cardio-Pulmonary Department

## 2019-10-28 ENCOUNTER — Inpatient Hospital Stay: Payer: 59

## 2019-10-28 ENCOUNTER — Encounter: Payer: Self-pay | Admitting: Internal Medicine

## 2019-10-28 ENCOUNTER — Other Ambulatory Visit: Payer: Self-pay

## 2019-10-28 DIAGNOSIS — Z86718 Personal history of other venous thrombosis and embolism: Secondary | ICD-10-CM

## 2019-10-28 LAB — GLUCOSE, CAPILLARY
Glucose-Capillary: 249 mg/dL — ABNORMAL HIGH (ref 70–99)
Glucose-Capillary: 228 mg/dL — ABNORMAL HIGH (ref 70–99)
Glucose-Capillary: 188 mg/dL — ABNORMAL HIGH (ref 70–99)
Glucose-Capillary: 212 mg/dL — ABNORMAL HIGH (ref 70–99)
Glucose-Capillary: 223 mg/dL — ABNORMAL HIGH (ref 70–99)
Glucose-Capillary: 203 mg/dL — ABNORMAL HIGH (ref 70–99)
Glucose-Capillary: 188 mg/dL — ABNORMAL HIGH (ref 70–99)
Glucose-Capillary: 180 mg/dL — ABNORMAL HIGH (ref 70–99)
Glucose-Capillary: 167 mg/dL — ABNORMAL HIGH (ref 70–99)
Glucose-Capillary: 161 mg/dL — ABNORMAL HIGH (ref 70–99)
Glucose-Capillary: 160 mg/dL — ABNORMAL HIGH (ref 70–99)

## 2019-10-28 LAB — CBC WITH DIFFERENTIAL/PLATELET
Abs Immature Granulocytes: 0.58 10*3/uL — ABNORMAL HIGH (ref 0.00–0.07)
Basophils Absolute: 0 10*3/uL (ref 0.0–0.1)
Basophils Relative: 0 %
Eosinophils Absolute: 0 10*3/uL (ref 0.0–0.5)
Eosinophils Relative: 0 %
HCT: 31.2 % — ABNORMAL LOW (ref 39.0–52.0)
Hemoglobin: 10.4 g/dL — ABNORMAL LOW (ref 13.0–17.0)
Immature Granulocytes: 3 %
Lymphocytes Relative: 2 %
Lymphs Abs: 0.5 10*3/uL — ABNORMAL LOW (ref 0.7–4.0)
MCH: 26.6 pg (ref 26.0–34.0)
MCHC: 33.3 g/dL (ref 30.0–36.0)
MCV: 79.8 fL — ABNORMAL LOW (ref 80.0–100.0)
Monocytes Absolute: 1.4 10*3/uL — ABNORMAL HIGH (ref 0.1–1.0)
Monocytes Relative: 7 %
Neutro Abs: 16.8 10*3/uL — ABNORMAL HIGH (ref 1.7–7.7)
Neutrophils Relative %: 88 %
Platelets: 174 10*3/uL (ref 150–400)
RBC: 3.91 MIL/uL — ABNORMAL LOW (ref 4.22–5.81)
RDW: 16 % — ABNORMAL HIGH (ref 11.5–15.5)
WBC: 19.2 10*3/uL — ABNORMAL HIGH (ref 4.0–10.5)
nRBC: 1.1 % — ABNORMAL HIGH (ref 0.0–0.2)

## 2019-10-28 LAB — COMPREHENSIVE METABOLIC PANEL
ALT: 109 U/L — ABNORMAL HIGH (ref 0–44)
AST: 96 U/L — ABNORMAL HIGH (ref 15–41)
Albumin: 2.2 g/dL — ABNORMAL LOW (ref 3.5–5.0)
Alkaline Phosphatase: 95 U/L (ref 38–126)
Anion gap: 12 (ref 5–15)
BUN: 74 mg/dL — ABNORMAL HIGH (ref 6–20)
CO2: 21 mmol/L — ABNORMAL LOW (ref 22–32)
Calcium: 6.5 mg/dL — ABNORMAL LOW (ref 8.9–10.3)
Chloride: 111 mmol/L (ref 98–111)
Creatinine, Ser: 5.62 mg/dL — ABNORMAL HIGH (ref 0.61–1.24)
GFR calc Af Amer: 12 mL/min — ABNORMAL LOW (ref >60)
GFR calc non Af Amer: 10 mL/min — ABNORMAL LOW (ref >60)
Glucose, Bld: 237 mg/dL — ABNORMAL HIGH (ref 70–99)
Potassium: 5.8 mmol/L — ABNORMAL HIGH (ref 3.5–5.1)
Sodium: 144 mmol/L (ref 135–145)
Total Bilirubin: 1.6 mg/dL — ABNORMAL HIGH (ref 0.3–1.2)
Total Protein: 5.9 g/dL — ABNORMAL LOW (ref 6.5–8.1)

## 2019-10-28 LAB — PROTIME-INR
INR: 2.1 — ABNORMAL HIGH (ref 0.8–1.2)
Prothrombin Time: 22.7 seconds — ABNORMAL HIGH (ref 11.4–15.2)

## 2019-10-28 LAB — PHOSPHORUS: Phosphorus: 7.8 mg/dL — ABNORMAL HIGH (ref 2.5–4.6)

## 2019-10-28 LAB — RENAL FUNCTION PANEL
Albumin: 2.5 g/dL — ABNORMAL LOW (ref 3.5–5.0)
Anion gap: 12 (ref 5–15)
BUN: 73 mg/dL — ABNORMAL HIGH (ref 6–20)
CO2: 22 mmol/L (ref 22–32)
Calcium: 7.7 mg/dL — ABNORMAL LOW (ref 8.9–10.3)
Chloride: 107 mmol/L (ref 98–111)
Creatinine, Ser: 5.96 mg/dL — ABNORMAL HIGH (ref 0.61–1.24)
GFR calc Af Amer: 11 mL/min — ABNORMAL LOW (ref >60)
GFR calc non Af Amer: 10 mL/min — ABNORMAL LOW (ref >60)
Glucose, Bld: 247 mg/dL — ABNORMAL HIGH (ref 70–99)
Phosphorus: 7.6 mg/dL — ABNORMAL HIGH (ref 2.5–4.6)
Potassium: 5.8 mmol/L — ABNORMAL HIGH (ref 3.5–5.1)
Sodium: 141 mmol/L (ref 135–145)

## 2019-10-28 LAB — C-REACTIVE PROTEIN: CRP: 20.7 mg/dL — ABNORMAL HIGH (ref <1.0)

## 2019-10-28 LAB — FERRITIN: Ferritin: 1493 ng/mL — ABNORMAL HIGH (ref 24–336)

## 2019-10-28 LAB — MAGNESIUM: Magnesium: 2.6 mg/dL — ABNORMAL HIGH (ref 1.7–2.4)

## 2019-10-28 LAB — FIBRIN DERIVATIVES D-DIMER (ARMC ONLY): Fibrin derivatives D-dimer (ARMC): >7500 ng/mL (FEU) — ABNORMAL HIGH (ref 0.00–499.00)

## 2019-10-28 MED ORDER — HEPARIN SODIUM (PORCINE) 5000 UNIT/ML IJ SOLN
5000.0000 [IU] | Freq: Three times a day (TID) | INTRAMUSCULAR | Status: DC
  Administered 2019-10-28 – 2019-10-29 (×3): 5000 [IU] via SUBCUTANEOUS
  Filled 2019-10-28 (×4): qty 1

## 2019-10-28 MED ORDER — HYDROCORTISONE NA SUCCINATE PF 100 MG IJ SOLR
50.0000 mg | Freq: Four times a day (QID) | INTRAMUSCULAR | Status: DC
  Administered 2019-10-28 – 2019-11-02 (×21): 50 mg via INTRAVENOUS
  Filled 2019-10-28 (×19): qty 2

## 2019-10-28 MED ORDER — COLCHICINE 0.6 MG PO TABS
0.3000 mg | ORAL_TABLET | Freq: Every day | ORAL | Status: DC
  Administered 2019-10-29 – 2019-11-01 (×4): 0.3 mg via ORAL
  Filled 2019-10-28 (×4): qty 0.5

## 2019-10-28 MED ORDER — DEXTROSE 50 % IV SOLN: 0.0000 mL | INTRAVENOUS | Status: DC | PRN

## 2019-10-28 MED ORDER — ARTIFICIAL TEARS OPHTHALMIC OINT
TOPICAL_OINTMENT | OPHTHALMIC | Status: DC | PRN
  Administered 2019-10-31 – 2019-11-01 (×2): 1 via OPHTHALMIC
  Filled 2019-10-28: qty 3.5

## 2019-10-28 MED ORDER — INSULIN ASPART 100 UNIT/ML ~~LOC~~ SOLN
3.0000 [IU] | SUBCUTANEOUS | Status: DC
  Administered 2019-10-29: 6 [IU] via SUBCUTANEOUS
  Administered 2019-10-29: 9 [IU] via SUBCUTANEOUS
  Filled 2019-10-28 (×2): qty 1

## 2019-10-28 MED ORDER — INSULIN DETEMIR 100 UNIT/ML ~~LOC~~ SOLN
29.0000 [IU] | Freq: Two times a day (BID) | SUBCUTANEOUS | Status: DC
  Administered 2019-10-28: 29 [IU] via SUBCUTANEOUS
  Filled 2019-10-28 (×2): qty 0.29

## 2019-10-28 MED ORDER — INSULIN REGULAR(HUMAN) IN NACL 100-0.9 UT/100ML-% IV SOLN
INTRAVENOUS | Status: DC
  Administered 2019-10-28: 8 [IU]/h via INTRAVENOUS
  Filled 2019-10-28: qty 100

## 2019-10-28 NOTE — Progress Notes (Signed)
CRRT stopped at 0230 due to increased filter pressures. Blood returned to the patient.   CRRT successfully restarted at 0320.  Carmel Sacramento, RN

## 2019-10-28 NOTE — Consult Note (Signed)
[address] [place], [place] [address] Phone [phone]. Fax [phone]  Date: 10/28/2019                  Patient Name:  Spencer Woodward  MRN: [id number]  DOB: 01-07-1961  Age / Sex: 59 y.o., male         PCP: Revelo, Presley Raddle, MD                 Service Requesting Consult: IM/ Erin Fulling, MD                 Reason for Consult: ARF            History of Present Illness: Patient is a 59 y.o. male with medical problems of DM,HTN , Obesity, who was admitted to [facility] on 10/21/2019 for evaluation of SOB, generalized malaise for 2 days prior to the presentation, got diagnosed with COVID pneumonia.Initially patient was placed on BIPAP, but due to severe ARDS, required intubation and mechanical ventilation since 10/26/19. Patient deteriorated with multiorgan failure including renal failure,  Patient was started on CRRT on August 19. Tolerating fair so far. Dialyzer had to be changed few times. Meeting UF removal goal of net 0. Still requiring pressors Patient sedated with fentanyl and Versed Continues to require ventilator support, fio2 50%   Vital Signs: Blood pressure 100/68, pulse (!) 125, temperature 97.7 F (36.5 C), temperature source Axillary, resp. rate (!) 30, height 6\' 3"  (1.905 m), weight (!) 145.4 kg, SpO2 97 %.   Intake/Output Summary (Last 24 hours) at 10/28/2019 0933 Last data filed at 10/28/2019 0600 Gross per 24 hour  Intake 2267.58 ml  Output 1300 ml  Net 967.58 ml    Weight trends: Filed Weights   11/08/2019 1658 10/25/19 0527 10/28/19 0332  Weight: (!) 149.7 kg (!) 147.3 kg (!) 145.4 kg    Physical Exam: General:  Critically ill apeearing,intubated and sedated  HEENT  Sclera -non-icteric.ETT in place, NGT  Lungs: Lung sounds diminished bilaterally, vent dependent  Heart::  Regular, tachycardic  Abdomen: Soft,non distended  Extremities:  No edema,cyanosis or erythema noted  Neurologic: Sedated   Skin: Warm, dry, no rashes or lesions  noted  Access: Left IJ temp cath  Foley: Oliguric with minimal dark tea colored urine . Foley cath       Lab results: Basic Metabolic Panel: Recent Labs  Lab 10/26/19 0514 10/26/19 0514 10/27/19 0555 10/27/19 1630 10/28/19 0341  NA 144   < > 146* 145 144  K 4.6   < > 6.6* 7.1* 5.8*  CL 106   < > 109 107 111  CO2 26   < > 23 23 21*  GLUCOSE 182*   < > 242* 256* 237*  BUN 32*   < > 66* 80* 74*  CREATININE 1.02   < > 4.12* 6.08* 5.62*  CALCIUM 8.0*   < > 7.3* 6.9* 6.5*  MG 2.9*  --  3.3*  --  2.6*  PHOS 3.7   < > 9.0* 10.6* 7.8*   < > = values in this interval not displayed.    Liver Function Tests: Recent Labs  Lab 10/28/19 0341  AST 96*  ALT 109*  ALKPHOS 95  BILITOT 1.6*  PROT 5.9*  ALBUMIN 2.2*   No results for input(s): LIPASE, AMYLASE in the last 168 hours. No results for input(s): AMMONIA in the last 168 hours.  CBC: Recent Labs  Lab 10/27/19 0555 10/28/19 0341  WBC 21.5* 19.2*  NEUTROABS 18.1* 16.8*  HGB 13.0 10.4*  HCT 38.0* 31.2*  MCV 78.0* 79.8*  PLT 251 174    Cardiac Enzymes: Recent Labs  Lab 10/27/19 1630  CKTOTAL 1,935*    BNP: Invalid input(s): POCBNP  CBG: Recent Labs  Lab 10/27/19 1631 10/27/19 1917 10/27/19 2327 10/28/19 0327 10/28/19 0731  GLUCAP 232* 208* 225* 249* 228*    Microbiology: Recent Results (from the past 720 hour(s))  Blood Culture (routine x 2)     Status: None   Collection Time: 10/14/2019  4:55 PM   Specimen: BLOOD  Result Value Ref Range Status   Specimen Description BLOOD BLOOD LEFT FOREARM  Final   Special Requests   Final    BOTTLES DRAWN AEROBIC AND ANAEROBIC Blood Culture results may not be optimal due to an excessive volume of blood received in culture bottles   Culture   Final    NO GROWTH 5 DAYS Performed at [facility], [address] [place], [place] [address]    Report Status 10/27/2019 FINAL  Final  SARS Coronavirus 2 by RT PCR (hospital order, performed in [facility]  hospital lab) Nasopharyngeal Nasopharyngeal Swab     Status: Abnormal   Collection Time: 11/04/2019  5:03 PM   Specimen: Nasopharyngeal Swab  Result Value Ref Range Status   SARS Coronavirus 2 POSITIVE (A) NEGATIVE Final    Comment: RESULT CALLED TO, READ BACK BY AND VERIFIED WITH: APRIL BRUMGARD 10/20/2019 AT 2027 HS (NOTE) SARS-CoV-2 target nucleic acids are DETECTED  SARS-CoV-2 RNA is generally detectable in upper respiratory specimens  during the acute phase of infection.  Positive results are indicative  of the presence of the identified virus, but do not rule out bacterial infection or co-infection with other pathogens not detected by the test.  Clinical correlation with patient history and  other diagnostic information is necessary to determine patient infection status.  The expected result is negative.  Fact Sheet for Patients:   BoilerBrush.com.cy   Fact Sheet for Healthcare Providers:   https://pope.com/    This test is not yet approved or cleared by the [place] FDA and  has been authorized for detection and/or diagnosis of SARS-CoV-2 by FDA under an Emergency Use Authorization (EUA).  This EUA will remain in effect (meaning this tes t can be used) for the duration of  the COVID-19 declaration under Section 564(b)(1) of the Act, 21 U.S.C. section 360-bbb-3(b)(1), unless the authorization is terminated or revoked sooner.  Performed at [facility], [address] [place], [place] [address]   Blood Culture (routine x 2)     Status: None   Collection Time: 10/24/2019  6:01 PM   Specimen: BLOOD  Result Value Ref Range Status   Specimen Description BLOOD RH  Final   Special Requests   Final    BOTTLES DRAWN AEROBIC AND ANAEROBIC Blood Culture adequate volume   Culture   Final    NO GROWTH 5 DAYS Performed at [facility], [address], [place], [place] [address]    Report Status 10/27/2019 FINAL   Final  MRSA PCR Screening     Status: None   Collection Time: 10/23/19  7:43 AM   Specimen: Nasal Mucosa; Nasopharyngeal  Result Value Ref Range Status   MRSA by PCR NEGATIVE NEGATIVE Final    Comment:        The GeneXpert MRSA Assay (FDA approved for NASAL specimens only), is one component of a comprehensive MRSA colonization surveillance program. It  is not intended to diagnose MRSA infection nor to guide or monitor treatment for MRSA infections. Performed at [facility], [address] [place], [place] [address]      Coagulation Studies: Recent Labs    10/26/19 0514 10/27/19 0555 10/28/19 0341  LABPROT 60.7* 73.4* 22.7*  INR 7.3* 9.4* 2.1*    Urinalysis: No results for input(s): COLORURINE, LABSPEC, PHURINE, GLUCOSEU, HGBUR, BILIRUBINUR, KETONESUR, PROTEINUR, UROBILINOGEN, NITRITE, LEUKOCYTESUR in the last 72 hours.  Invalid input(s): APPERANCEUR      Imaging: DG Chest Port 1 View  Result Date: 10/27/2019 CLINICAL DATA:  Acute respiratory failure. EXAM: PORTABLE CHEST 1 VIEW COMPARISON:  11-09-2019.  CT 2019-11-09. FINDINGS: Tracheostomy tube noted with tip 4 cm above the carina. PICC line noted with tip over SVC. Heart size normal. Bilateral multifocal pulmonary infiltrates again noted. Interim improvement in aeration from prior exam. No pleural effusion or pneumothorax. Degenerative change thoracic spine. IMPRESSION: 1. Tracheostomy tube noted with tip 4 cm above the carina. PICC line noted with tip over SVC. 2. Bilateral multifocal pulmonary infiltrates again noted. Interim improvement in aeration from prior exam. Electronically Signed   By: Maisie Fus  Register   On: 10/27/2019 05:12     Assessment & Plan: Pt is a 59 y.o. African-American  male with Medical problems of diabetes hypertension and obesity , was admitted on 09-Nov-2019 with Acute respiratory failure with hypoxia (HCC) [J96.01] Acute respiratory failure due to COVID-19 (HCC) [U07.1,  J96.00] Pneumonia due to COVID-19 virus [U07.1, J12.82] , was on non invasive ventilation initially, but required mechanical ventilation on 10/26/19 due to severe ARDS. Currently on FIO2 60%.   # Acute renal failure Patient sustained multiorgan failure, including renal failure. Oliguric with very minimal dark tea colored urine draining via F/C.  CRRT started 10/27/2019 Continue to 2k/2.5 ca solution Prefilter 300 cc/h Dialysate 1500 cc/h Post filter 300 cc/hr   #Acute respiratory failure, secondary to COVID-19 pneumonia Currently ventilator dependent Management as per ICU team  #Hyperkalemia Will use low potassium bath for CRRT Improving  #Severe hyperphosphatemia Rhabdomyolysis suspected, CK elevated to 1900    #Shock liver INR elevated but improving   With multiple severe comorbidities, overall prognosis appears poor   LOS: 6 Abbi Mancini 8/20/20219:33 AM    Note: This note was prepared with Dragon dictation. Any transcription errors are unintentional

## 2019-10-28 NOTE — Progress Notes (Signed)
Assisted tele visit to patient with wife.  Hanna Ra D Siriah Treat, RN   

## 2019-10-28 NOTE — Progress Notes (Signed)
CRITICAL CARE NOTE 59 year old male with PMH significant for uncontrolled type 2 DM, HLD, HTN, Thyroid nodule, gout, morbid obesity and pulmonary embolism presented to [facility] ER on 08/14 via EMS from home with generalized malaise, shortness of breath and chest discomfort with inspiration. Admitted with acute hypoxic respiratory failure secondary to COVID-19 Pneumonia. On high flow O2 alternating with BiPAP  SIGNIFICANT EVENTS 08/14: Pt admitted tostepdown unit with COVID pneumonia 08/16:Remains housed in EDP due to no rooms. Still stepdown status, remains BiPAP dependent 8/17remains on biPAP, on precedex 8/18 Intubated, sedated, severe ARDS 8/19 multiorgan failure, progressive hypoxia and shock a with renal failure, started CRRT 8/20 remains in multiorgan failure, on pressors    STUDIES: 8/14:Chest XrayshowsInterval development of patchy bilateral airspace opacities which may represent pneumonia 8/14: CTA Chest Slightly suboptimal opacification of the main pulmonary artery, however no central or proximal segmental pulmonary embolism.Extensive multifocal airspace opacities, consistent with atypical viral pneumonia  CULTURES: 8/14:BCxpending  ANTIBIOTICS: Ceftriaxone 8/14> Azithromycin8/14>   CC  follow up respiratory failure  SUBJECTIVE Patient remains critically ill Prognosis is guarded CRRT  Vent Mode: PRVC FiO2 (%):  [50 %-60 %] 50 % Set Rate:  [30 bmp] 30 bmp Vt Set:  [550 mL] 550 mL PEEP:  [20 cmH20] 20 cmH20    BP 100/68   Pulse (!) 125   Temp 97.7 F (36.5 C) (Axillary)   Resp (!) 30   Ht 6\' 3"  (1.905 m)   Wt (!) 145.4 kg   SpO2 97%   BMI 40.07 kg/m    I/O last 3 completed shifts: In: 3460.5 [I.V.:3069; IV Piggyback:391.5] Out: 1455 [Urine:165; Other:1290] No intake/output data recorded.  SpO2: 97 % O2 Flow Rate (L/min): 50 L/min FiO2 (%): 50 %  Estimated body mass index is 40.07 kg/m as calculated from the following:   Height as of  this encounter: 6\' 3"  (1.905 m).   Weight as of this encounter: 145.4 kg.   CBC    Component Value Date/Time   WBC 19.2 (H) 10/28/2019 0341   RBC 3.91 (L) 10/28/2019 0341   HGB 10.4 (L) 10/28/2019 0341   HGB 15.3 12/16/2011 1500   HCT 31.2 (L) 10/28/2019 0341   HCT 43.9 12/16/2011 1500   PLT 174 10/28/2019 0341   PLT 214 12/16/2011 1500   MCV 79.8 (L) 10/28/2019 0341   MCV 79 (L) 12/16/2011 1500   MCH 26.6 10/28/2019 0341   MCHC 33.3 10/28/2019 0341   RDW 16.0 (H) 10/28/2019 0341   RDW 13.2 12/16/2011 1500   LYMPHSABS 0.5 (L) 10/28/2019 0341   MONOABS 1.4 (H) 10/28/2019 0341   EOSABS 0.0 10/28/2019 0341   BASOSABS 0.0 10/28/2019 0341   BMP Latest Ref Rng & Units 10/28/2019 10/27/2019 10/27/2019  Glucose 70 - 99 mg/dL 10/29/2019) 10/29/2019) 106(Y)  BUN 6 - 20 mg/dL 694(W) 546(E) 70(J)  Creatinine 0.61 - 1.24 mg/dL 50(K) 93(G) 1.82(X)  Sodium 135 - 145 mmol/L 144 145 146(H)  Potassium 3.5 - 5.1 mmol/L 5.8(H) 7.1(HH) 6.6(HH)  Chloride 98 - 111 mmol/L 111 107 109  CO2 22 - 32 mmol/L 21(L) 23 23  Calcium 8.9 - 10.3 mg/dL 9.37(J) 6.9(L) 7.3(L)    SIGNIFICANT EVENTS   REVIEW OF SYSTEMS  PATIENT IS UNABLE TO PROVIDE COMPLETE REVIEW OF SYSTEMS DUE TO SEVERE CRITICAL ILLNESS        PHYSICAL EXAMINATION:  GENERAL:critically ill appearing, +resp distress HEAD: Normocephalic, atraumatic.  EYES: Pupils equal, round, reactive to light.  No scleral icterus.  MOUTH: Moist mucosal membrane.  NECK: Supple.  PULMONARY: +rhonchi, +wheezing CARDIOVASCULAR: S1 and S2. Regular rate and rhythm. No murmurs, rubs, or gallops.  GASTROINTESTINAL: Soft, nontender, -distended.  Positive bowel sounds.   MUSCULOSKELETAL: No swelling, clubbing, or edema.  NEUROLOGIC: obtunded, GCS<8 SKIN:intact,warm,dry  MEDICATIONS: I have reviewed all medications and confirmed regimen as documented   CULTURE RESULTS   Recent Results (from the past 240 hour(s))  Blood Culture (routine x 2)     Status: None    Collection Time: 10-29-2019  4:55 PM   Specimen: BLOOD  Result Value Ref Range Status   Specimen Description BLOOD BLOOD LEFT FOREARM  Final   Special Requests   Final    BOTTLES DRAWN AEROBIC AND ANAEROBIC Blood Culture results may not be optimal due to an excessive volume of blood received in culture bottles   Culture   Final    NO GROWTH 5 DAYS Performed at [facility], [address], [place], [place] [address]    Report Status 10/27/2019 FINAL  Final  SARS Coronavirus 2 by RT PCR (hospital order, performed in [facility] hospital lab) Nasopharyngeal Nasopharyngeal Swab     Status: Abnormal   Collection Time: 2019/10/29  5:03 PM   Specimen: Nasopharyngeal Swab  Result Value Ref Range Status   SARS Coronavirus 2 POSITIVE (A) NEGATIVE Final    Comment: RESULT CALLED TO, READ BACK BY AND VERIFIED WITH: APRIL BRUMGARD 29-Oct-2019 AT 2027 HS (NOTE) SARS-CoV-2 target nucleic acids are DETECTED  SARS-CoV-2 RNA is generally detectable in upper respiratory specimens  during the acute phase of infection.  Positive results are indicative  of the presence of the identified virus, but do not rule out bacterial infection or co-infection with other pathogens not detected by the test.  Clinical correlation with patient history and  other diagnostic information is necessary to determine patient infection status.  The expected result is negative.  Fact Sheet for Patients:   BoilerBrush.com.cy   Fact Sheet for Healthcare Providers:   https://pope.com/    This test is not yet approved or cleared by the [place] FDA and  has been authorized for detection and/or diagnosis of SARS-CoV-2 by FDA under an Emergency Use Authorization (EUA).  This EUA will remain in effect (meaning this tes t can be used) for the duration of  the COVID-19 declaration under Section 564(b)(1) of the Act, 21 U.S.C. section 360-bbb-3(b)(1), unless the authorization  is terminated or revoked sooner.  Performed at [facility], [address] [place], [place] [address]   Blood Culture (routine x 2)     Status: None   Collection Time: October 29, 2019  6:01 PM   Specimen: BLOOD  Result Value Ref Range Status   Specimen Description BLOOD RH  Final   Special Requests   Final    BOTTLES DRAWN AEROBIC AND ANAEROBIC Blood Culture adequate volume   Culture   Final    NO GROWTH 5 DAYS Performed at [facility], [address] [place], [place] [address]    Report Status 10/27/2019 FINAL  Final  MRSA PCR Screening     Status: None   Collection Time: 10/23/19  7:43 AM   Specimen: Nasal Mucosa; Nasopharyngeal  Result Value Ref Range Status   MRSA by PCR NEGATIVE NEGATIVE Final    Comment:        The GeneXpert MRSA Assay (FDA approved for NASAL specimens only), is one component of a comprehensive MRSA colonization surveillance program. It is not intended to diagnose MRSA infection nor  to guide or monitor treatment for MRSA infections. Performed at [facility], [address] [place], [place] [address]    \  The CXR was Independently Reviewed By Me Today CXR reviewed-8/19  Bilateral multifocal pulmonary infiltrates again noted. Interim improvement in aeration from prior exam.  Nutrition Status: Nutrition Problem: Inadequate oral intake Etiology: inability to eat Signs/Symptoms: NPO status Interventions: Refer to RD note for recommendations     Indwelling Urinary Catheter continued, requirement due to   Reason to continue Indwelling Urinary Catheter strict Intake/Output monitoring for hemodynamic instability   Central Line/ continued, requirement due to  Reason to continue [employer] Monitoring of central venous pressure or other hemodynamic parameters and poor IV access   Ventilator continued, requirement due to severe respiratory failure   Ventilator Sedation RASS 0 to -2      ASSESSMENT AND  PLAN SYNOPSIS  Admitted for severe Acute hypoxemic respiratory failure due to COVID-19 pneumonia / ARDS Mechanical ventilation via ARDS protocol, target PRVC 6 cc/kg Wean PEEP and FiO2 as able Goal plateau pressure less than 30, driving pressure less than 15 Paralytics if necessary for vent synchrony, gas exchange Cycle prone positioning if necessary for oxygenation Deep sedation per PAD protocol, goal RASS -4, currently fentanyl, midazolam Diuresis as blood pressure and renal function can tolerate, goal CVP 5-8.   diuresis as tolerated based on Kidney function VAP prevention order set Remdesivir, plan for 5 days Dexamethasone, plan for 10 days Follow inflammatory markers: Ferritin, D-dimer, CRP, IL-6, LDH Assess for possible tocilizumab  Vitamin C, zinc Plan to repeat and check resp cultures    Severe ACUTE Hypoxic and Hypercapnic Respiratory Failure -continue Full MV support -continue Bronchodilator Therapy -Wean Fio2 and PEEP as tolerated -VAP/VENT bundle implementation  ACUTE DIASTOLIC CARDIAC FAILURE-   Morbid obesity, possible OSA.   Will certainly impact respiratory mechanics, ventilator weaning   ACUTE KIDNEY INJURY/Renal Failure -continue Foley Catheter-assess need -Avoid nephrotoxic agents -Follow urine output, BMP -Ensure adequate renal perfusion, optimize oxygenation -Renal dose medications On CRRT     NEUROLOGY Acute toxic metabolic encephalopathy, need for sedation Goal RASS -2 to -3  SHOCK-SEPSIS/HYPOVOLUMIC/CARDIOGENIC -use vasopressors to keep MAP>65 -follow up cultures -emperic ABX -consider stress dose steroids   CARDIAC ICU monitoring  ID -continue IV abx as prescibed -follow up cultures  GI GI PROPHYLAXIS as indicated INR elevated VIT K given  NUTRITIONAL STATUS Nutrition Status: Nutrition Problem: Inadequate oral intake Etiology: inability to eat Signs/Symptoms: NPO status Interventions: Refer to RD note for  recommendations    Constipation protocol as indicated Will attempt [place]  ENDO - will use ICU hypoglycemic\Hyperglycemia protocol if indicated     ELECTROLYTES -follow labs as needed -replace as needed -pharmacy consultation and following   DVT/GI PRX ordered and assessed TRANSFUSIONS AS NEEDED MONITOR FSBS I Assessed the need for Labs I Assessed the need for Foley I Assessed the need for Central Venous Line Family Discussion when available I Assessed the need for Mobilization I made an Assessment of medications to be adjusted accordingly Safety Risk assessment completed   CASE DISCUSSED IN MULTIDISCIPLINARY ROUNDS WITH ICU TEAM  Critical Care Time devoted to patient care services described in this note is 34 minutes.   Overall, patient is critically ill, prognosis is guarded.  Patient with Multiorgan failure and at high risk for cardiac arrest and death.    Lucie Leather, M.D.  [facility]  Medical Director [facility] Medical Director [facility] Cardio-Pulmonary Department

## 2019-10-28 NOTE — Progress Notes (Signed)
Assisted family with tele visit via E-link. 

## 2019-10-28 NOTE — Progress Notes (Signed)
ANTICOAGULATION CONSULT NOTE  Pharmacy Consult for Warfarin Dosing Indication: hx of  DVT  No Known Allergies  Patient Measurements: Height: 6\' 3"  (190.5 cm) Weight: (!) 145.4 kg (320 lb 8.8 oz) IBW/kg (Calculated) : 84.5  Vital Signs: Temp: 100.6 F (38.1 C) (08/20 0800) Temp Source: Axillary (08/20 0800) BP: 89/68 (08/20 1000) Pulse Rate: 120 (08/20 1000)  Labs: Recent Labs    10/26/19 0514 10/26/19 0514 10/27/19 0555 10/27/19 1630 10/28/19 0341  HGB 13.4   < > 13.0  --  10.4*  HCT 36.1*  --  38.0*  --  31.2*  PLT 214  --  251  --  174  LABPROT 60.7*  --  73.4*  --  22.7*  INR 7.3*  --  9.4*  --  2.1*  CREATININE 1.02   < > 4.12* 6.08* 5.62*  CKTOTAL  --   --   --  1,935*  --    < > = values in this interval not displayed.    Estimated Creatinine Clearance: 22.3 mL/min (A) (by C-G formula based on SCr of 5.62 mg/dL (H)).  Medical History: Past Medical History:  Diagnosis Date  . Diabetes mellitus without complication [facility])     Assessment: 59 year old male with history of DM, HTN, and PE. Patient is COVID +. Patient on warfarin PTA for h/o PE several years ago. 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 He reports taking warfarin 11 mg daily.  Goal of Therapy:  INR 2-3 Monitor platelets by anticoagulation protocol: Yes   Date INR Dose  8/15 4.3 HOLD  8/16 4.6 HOLD  8/17 6.6 HOLD  8/18 7.3 HOLD  8/19 9.4 HOLD, vitK 10 mg  8/20 2.1        Plan:  INR with significant trend down after dose of VitK. Patient did have blood in mouth when RN suctioning. Per discussion on rounds, decision made to start patient on SQ heparin for VTE prophylaxis. Will not restart warfarin right now. 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 Will avoid risk of restarting warfarin for now. Will continue to follow INR trend at least for the next few  days.  9/20, PharmD 10/28/2019 11:21 AM

## 2019-10-28 NOTE — Progress Notes (Signed)
Inpatient Diabetes Program Recommendations  AACE/ADA: New Consensus Statement on Inpatient Glycemic Control (2015)  Target Ranges:  Prepandial:   less than 140 mg/dL      Peak postprandial:   less than 180 mg/dL (1-2 hours)      Critically ill patients:  140 - 180 mg/dL   Lab Results  Component Value Date   GLUCAP 228 (H) 10/28/2019   HGBA1C 9.4 (H) 10/23/2019    Review of Glycemic Control Results for Spencer Woodward, Spencer Woodward (MRN [id number]) as of 10/28/2019 10:45  Ref. Range 10/27/2019 16:31 10/27/2019 19:17 10/27/2019 23:27 10/28/2019 03:27 10/28/2019 07:31  Glucose-Capillary Latest Ref Range: 70 - 99 mg/dL 093 (H) 267 (H) 124 (H) 249 (H) 228 (H)   Admit with: Acute hypoxic respiratory failure secondary to COVID-19 pneumonia  History: DM  Home DM Meds: Jardiance 25 mg Daily                             Glipizide 10 mg Daily                             Soliqua (Insulin glargine + Lixisenatide) 60 units Daily                             Metformin 1000 mg BIDDiabetes history: DM 2 Current orders for Inpatient glycemic control:  Lantus 30 units bid, Novolog resistant q 4 hours Solucortef 50 mg q 6 hours  Inpatient Diabetes Program Recommendations:    Note that blood sugars trending>200 mg/dL.  Lantus now bid.  If blood sugars continue to be elevated, may need IV insulin/ ICU glycemic control order set?    If IV insulin not started, may need to increase Lantus to 35 units bid.   Thanks  Beryl Meager, RN, BC-ADM Inpatient Diabetes Coordinator Pager [phone] (8a-5p)

## 2019-10-28 NOTE — Progress Notes (Signed)
CRRT stopped at 1215 d/t rising filter pressures.  Large clot of very dark blood noted in thermax bag in same location it was seen in earlier filter change.  Therefore writing RN opted to switch to new machine.  Rinseback performed, lines packed w/heparin and new filter/auto effluent loaded.  Heparin withdrawn and CRRT restarted at 1330.   Pt turned Q2H this shift, no skin breakdown noted.  Lacrilube applied to eyes d/t exophthalmus.

## 2019-10-28 NOTE — Progress Notes (Addendum)
CRRT paused for filter change at 0749 d/t increased filter pressure.  Rinseback performed. Restarted at 0837 w/o incident Pt with excessive dried blood in oral cavity.  Cleaned his mouth and bathed him.  MD spoke with spouse and gave daily update.  Dobhoff attempted in left nare per MD Kasa, this RN was able to insert to 60 cms.  Tube confirmed by XRay.  Advanced to 65 cms per xray result and secured. Pt unable to follow commands, reacts minimally to pain/noxious stimuli.

## 2019-10-28 NOTE — Progress Notes (Signed)
Assisted tele visit to patient with family member.  Sulayman Manning D Haden Cavenaugh, RN   

## 2019-10-29 LAB — CBC WITH DIFFERENTIAL/PLATELET
Abs Immature Granulocytes: 0.72 10*3/uL — ABNORMAL HIGH (ref 0.00–0.07)
Basophils Absolute: 0 10*3/uL (ref 0.0–0.1)
Basophils Relative: 0 %
Eosinophils Absolute: 0 10*3/uL (ref 0.0–0.5)
Eosinophils Relative: 0 %
HCT: 28.8 % — ABNORMAL LOW (ref 39.0–52.0)
Hemoglobin: 9.9 g/dL — ABNORMAL LOW (ref 13.0–17.0)
Immature Granulocytes: 3 %
Lymphocytes Relative: 2 %
Lymphs Abs: 0.4 10*3/uL — ABNORMAL LOW (ref 0.7–4.0)
MCH: 27.1 pg (ref 26.0–34.0)
MCHC: 34.4 g/dL (ref 30.0–36.0)
MCV: 78.9 fL — ABNORMAL LOW (ref 80.0–100.0)
Monocytes Absolute: 1.9 10*3/uL — ABNORMAL HIGH (ref 0.1–1.0)
Monocytes Relative: 8 %
Neutro Abs: 21.1 10*3/uL — ABNORMAL HIGH (ref 1.7–7.7)
Neutrophils Relative %: 87 %
Platelets: 138 10*3/uL — ABNORMAL LOW (ref 150–400)
RBC: 3.65 MIL/uL — ABNORMAL LOW (ref 4.22–5.81)
RDW: 15.9 % — ABNORMAL HIGH (ref 11.5–15.5)
WBC: 24.1 10*3/uL — ABNORMAL HIGH (ref 4.0–10.5)
nRBC: 1.9 % — ABNORMAL HIGH (ref 0.0–0.2)

## 2019-10-29 LAB — PROTIME-INR
INR: 3.2 — ABNORMAL HIGH (ref 0.8–1.2)
Prothrombin Time: 31.7 seconds — ABNORMAL HIGH (ref 11.4–15.2)

## 2019-10-29 LAB — GLUCOSE, CAPILLARY
Glucose-Capillary: 151 mg/dL — ABNORMAL HIGH (ref 70–99)
Glucose-Capillary: 158 mg/dL — ABNORMAL HIGH (ref 70–99)
Glucose-Capillary: 195 mg/dL — ABNORMAL HIGH (ref 70–99)
Glucose-Capillary: 200 mg/dL — ABNORMAL HIGH (ref 70–99)
Glucose-Capillary: 201 mg/dL — ABNORMAL HIGH (ref 70–99)
Glucose-Capillary: 201 mg/dL — ABNORMAL HIGH (ref 70–99)
Glucose-Capillary: 219 mg/dL — ABNORMAL HIGH (ref 70–99)

## 2019-10-29 LAB — BASIC METABOLIC PANEL
Anion gap: 15 (ref 5–15)
BUN: 66 mg/dL — ABNORMAL HIGH (ref 6–20)
CO2: 23 mmol/L (ref 22–32)
Calcium: 8.4 mg/dL — ABNORMAL LOW (ref 8.9–10.3)
Chloride: 101 mmol/L (ref 98–111)
Creatinine, Ser: 5.19 mg/dL — ABNORMAL HIGH (ref 0.61–1.24)
GFR calc Af Amer: 13 mL/min — ABNORMAL LOW (ref >60)
GFR calc non Af Amer: 11 mL/min — ABNORMAL LOW (ref >60)
Glucose, Bld: 208 mg/dL — ABNORMAL HIGH (ref 70–99)
Potassium: 5 mmol/L (ref 3.5–5.1)
Sodium: 139 mmol/L (ref 135–145)

## 2019-10-29 LAB — RENAL FUNCTION PANEL
Albumin: 2.3 g/dL — ABNORMAL LOW (ref 3.5–5.0)
Anion gap: 14 (ref 5–15)
BUN: 70 mg/dL — ABNORMAL HIGH (ref 6–20)
CO2: 22 mmol/L (ref 22–32)
Calcium: 8.3 mg/dL — ABNORMAL LOW (ref 8.9–10.3)
Chloride: 102 mmol/L (ref 98–111)
Creatinine, Ser: 5.3 mg/dL — ABNORMAL HIGH (ref 0.61–1.24)
GFR calc Af Amer: 13 mL/min — ABNORMAL LOW (ref >60)
GFR calc non Af Amer: 11 mL/min — ABNORMAL LOW (ref >60)
Glucose, Bld: 235 mg/dL — ABNORMAL HIGH (ref 70–99)
Phosphorus: 5.6 mg/dL — ABNORMAL HIGH (ref 2.5–4.6)
Potassium: 4.5 mmol/L (ref 3.5–5.1)
Sodium: 138 mmol/L (ref 135–145)

## 2019-10-29 MED ORDER — ALTEPLASE 2 MG IJ SOLR
2.0000 mg | Freq: Once | INTRAMUSCULAR | Status: AC
  Administered 2019-10-29: 2 mg
  Filled 2019-10-29: qty 2

## 2019-10-29 MED ORDER — HEPARIN SODIUM (PORCINE) 5000 UNIT/ML IJ SOLN
5000.0000 [IU] | Freq: Three times a day (TID) | INTRAMUSCULAR | Status: DC
  Administered 2019-10-30 – 2019-11-01 (×6): 5000 [IU] via SUBCUTANEOUS
  Filled 2019-10-29 (×5): qty 1

## 2019-10-29 MED ORDER — SODIUM CHLORIDE 0.9 % IV SOLN
INTRAVENOUS | Status: DC | PRN
  Administered 2019-10-29: 250 mL via INTRAVENOUS

## 2019-10-29 MED ORDER — INSULIN DETEMIR 100 UNIT/ML ~~LOC~~ SOLN
29.0000 [IU] | Freq: Two times a day (BID) | SUBCUTANEOUS | Status: DC
  Administered 2019-10-29 – 2019-10-31 (×5): 29 [IU] via SUBCUTANEOUS
  Filled 2019-10-29 (×6): qty 0.29

## 2019-10-29 MED ORDER — INSULIN ASPART 100 UNIT/ML ~~LOC~~ SOLN
3.0000 [IU] | SUBCUTANEOUS | Status: DC
  Administered 2019-10-29: 6 [IU] via SUBCUTANEOUS
  Administered 2019-10-29 (×2): 9 [IU] via SUBCUTANEOUS
  Administered 2019-10-29: 6 [IU] via SUBCUTANEOUS
  Administered 2019-10-30: 9 [IU] via SUBCUTANEOUS
  Filled 2019-10-29 (×5): qty 1

## 2019-10-29 NOTE — Progress Notes (Signed)
Venous port clotted off, Dr. Thedore Mins notified- consulted with Ms. Luci Bank, NP cath flo ordered and will restart CRRT once line is operational. Will continue to monitor closely.

## 2019-10-29 NOTE — Progress Notes (Signed)
Sedation off as of 1011 this AM, pt with no response to painful/noxious stimuli.  Pupils 2, equal, sluggish.  No corneal reflexes observed.  Per Dr Belia Heman pt will need brain MRI or head CT within the next 12-24 hrs if he does not begin to awaken.  Sedation will stay off for now.  Levo titrated to 20 mcgs.  CRRT running w/o incident.  Spouse visiting on the elink monitor at this time.  Pt bathed this shift and turned Q2H.  Oral care done hourly d/t dry oral tissues.

## 2019-10-29 NOTE — Progress Notes (Signed)
ANTICOAGULATION CONSULT NOTE  Pharmacy Consult for Warfarin Dosing Indication: hx of  DVT  No Known Allergies  Patient Measurements: Height: 6\' 3"  (190.5 cm) Weight: (!) 143.6 kg (316 lb 9.3 oz) IBW/kg (Calculated) : 84.5  Vital Signs: Temp: 97.3 F (36.3 C) (08/21 0800) Temp Source: Oral (08/21 0800) BP: 112/69 (08/21 1100) Pulse Rate: 113 (08/21 1100)  Labs: Recent Labs    10/27/19 0555 10/27/19 0555 10/27/19 1630 10/28/19 0341 10/28/19 1617 10/29/19 0600 10/29/19 0913  HGB 13.0   < >  --  10.4*  --  9.9*  --   HCT 38.0*  --   --  31.2*  --  28.8*  --   PLT 251  --   --  174  --  138*  --   LABPROT 73.4*  --   --  22.7*  --  31.7*  --   INR 9.4*  --   --  2.1*  --  3.2*  --   CREATININE 4.12*   < > 6.08* 5.62* 5.96*  --  5.19*  CKTOTAL  --   --  1,935*  --   --   --   --    < > = values in this interval not displayed.    Estimated Creatinine Clearance: 24 mL/min (A) (by C-G formula based on SCr of 5.19 mg/dL (H)).  Medical History: Past Medical History:  Diagnosis Date  . Diabetes mellitus without complication [facility])     Assessment: 59 year old male with history of DM, HTN, and PE. Patient is COVID +. Patient on warfarin PTA for h/o PE several years ago. 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 He reports taking warfarin 11 mg daily.  Goal of Therapy:  INR 2-3 Monitor platelets by anticoagulation protocol: Yes   Date INR Dose  8/15 4.3 HOLD  8/16 4.6 HOLD  8/17 6.6 HOLD  8/18 7.3 HOLD  8/19 9.4 HOLD, vitK 10 mg  8/20 2.1 Hold  8/21 3.2 Hold           Plan:   8/20: Patient did have blood in mouth when RN suctioning. Per discussion on rounds, decision made to start patient on SQ heparin for VTE prophylaxis. Will not restart warfarin right now. 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 Will avoid risk of restarting warfarin  for now. Will continue to follow INR trend at least for the next few days.  8/21: INR 3.2. Will reschedule heparin subq till tomorrow 8/22 since pt INR essentially therapeutic. F/u INR in am  Codie Hainer A, PharmD 10/29/2019 11:56 AM

## 2019-10-29 NOTE — Progress Notes (Signed)
[address] [place], [place] [address] Phone [phone]. Fax [phone]  Date: 10/29/2019                  Patient Name:  Spencer Woodward  MRN: [id number]  DOB: [birth year]  Age / Sex: 59 y.o., male         PCP: Revelo, Presley Raddle, MD                 Service Requesting Consult: IM/ Erin Fulling, MD                 Reason for Consult: ARF            History of Present Illness: Patient is a 59 y.o. male with medical problems of DM,HTN , Obesity, who was admitted to [facility] on 10/31/2019 for evaluation of SOB, generalized malaise for 2 days prior to the presentation, got diagnosed with COVID pneumonia.Initially patient was placed on BIPAP, but due to severe ARDS, required intubation and mechanical ventilation since 10/26/19. Patient deteriorated with multiorgan failure including renal failure,   Patient was started on CRRT on August 19. Tolerating fair so far. Dialyzer had to be changed few times. Meeting UF removal goal of net 0. Still requiring pressors- dose turned down today Patient sedated with fentanyl and Versed- minimally responsive Continues to require ventilator support, fio2 50%   Vital Signs: Blood pressure 120/77, pulse (!) 105, temperature 97.8 F (36.6 C), temperature source Axillary, resp. rate (!) 25, height 6\' 3"  (1.905 m), weight (!) 143.6 kg, SpO2 98 %.   Intake/Output Summary (Last 24 hours) at 10/29/2019 0858 Last data filed at 10/29/2019 0800 Gross per 24 hour  Intake 2550.07 ml  Output 2477.2 ml  Net 72.87 ml    Weight trends: Filed Weights   10/25/19 0527 10/28/19 0332 10/29/19 0500  Weight: (!) 147.3 kg (!) 145.4 kg (!) 143.6 kg    Physical Exam: General:  Critically ill appearing,intubated and sedated  HEENT  Sclera -non-icteric.ETT in place, NGT  Lungs:  vent dependent  Heart::  Regular, tachycardic  Abdomen: Soft,non distended  Extremities:  No edema,cyanosis or erythema noted  Neurologic: Sedated   Skin: Warm, dry, no rashes  or lesions noted  Access: Left IJ temp cath  Foley: Oliguric with minimal dark tea colored urine . Foley cath       Lab results: Basic Metabolic Panel: Recent Labs  Lab 10/26/19 0514 10/26/19 0514 10/27/19 0555 10/27/19 0555 10/27/19 1630 10/28/19 0341 10/28/19 1617  NA 144   < > 146*   < > 145 144 141  K 4.6   < > 6.6*   < > 7.1* 5.8* 5.8*  CL 106   < > 109   < > 107 111 107  CO2 26   < > 23   < > 23 21* 22  GLUCOSE 182*   < > 242*   < > 256* 237* 247*  BUN 32*   < > 66*   < > 80* 74* 73*  CREATININE 1.02   < > 4.12*   < > 6.08* 5.62* 5.96*  CALCIUM 8.0*   < > 7.3*   < > 6.9* 6.5* 7.7*  MG 2.9*  --  3.3*  --   --  2.6*  --   PHOS 3.7   < > 9.0*   < > 10.6* 7.8* 7.6*   < > = values in this interval not displayed.    Liver Function Tests: Recent Labs  Lab 10/28/19 0341 10/28/19 0341 10/28/19 1617  AST 96*  --   --   ALT 109*  --   --   ALKPHOS 95  --   --   BILITOT 1.6*  --   --   PROT 5.9*  --   --   ALBUMIN 2.2*   < > 2.5*   < > = values in this interval not displayed.   No results for input(s): LIPASE, AMYLASE in the last 168 hours. No results for input(s): AMMONIA in the last 168 hours.  CBC: Recent Labs  Lab 10/28/19 0341 10/29/19 0600  WBC 19.2* 24.1*  NEUTROABS 16.8* 21.1*  HGB 10.4* 9.9*  HCT 31.2* 28.8*  MCV 79.8* 78.9*  PLT 174 138*    Cardiac Enzymes: Recent Labs  Lab 10/27/19 1630  CKTOTAL 1,935*    BNP: Invalid input(s): POCBNP  CBG: Recent Labs  Lab 10/28/19 2258 10/29/19 0009 10/29/19 0116 10/29/19 0421 10/29/19 0718  GLUCAP 160* 158* 151* 201* 200*    Microbiology: Recent Results (from the past 720 hour(s))  Blood Culture (routine x 2)     Status: None   Collection Time: 10/10/2019  4:55 PM   Specimen: BLOOD  Result Value Ref Range Status   Specimen Description BLOOD BLOOD LEFT FOREARM  Final   Special Requests   Final    BOTTLES DRAWN AEROBIC AND ANAEROBIC Blood Culture results may not be optimal due to an  excessive volume of blood received in culture bottles   Culture   Final    NO GROWTH 5 DAYS Performed at [facility], [address] [place], [place] [address]    Report Status 10/27/2019 FINAL  Final  SARS Coronavirus 2 by RT PCR (hospital order, performed in [facility] hospital lab) Nasopharyngeal Nasopharyngeal Swab     Status: Abnormal   Collection Time: 10/17/2019  5:03 PM   Specimen: Nasopharyngeal Swab  Result Value Ref Range Status   SARS Coronavirus 2 POSITIVE (A) NEGATIVE Final    Comment: RESULT CALLED TO, READ BACK BY AND VERIFIED WITH: APRIL BRUMGARD 11/04/2019 AT 2027 HS (NOTE) SARS-CoV-2 target nucleic acids are DETECTED  SARS-CoV-2 RNA is generally detectable in upper respiratory specimens  during the acute phase of infection.  Positive results are indicative  of the presence of the identified virus, but do not rule out bacterial infection or co-infection with other pathogens not detected by the test.  Clinical correlation with patient history and  other diagnostic information is necessary to determine patient infection status.  The expected result is negative.  Fact Sheet for Patients:   BoilerBrush.com.cy   Fact Sheet for Healthcare Providers:   https://pope.com/    This test is not yet approved or cleared by the [place] FDA and  has been authorized for detection and/or diagnosis of SARS-CoV-2 by FDA under an Emergency Use Authorization (EUA).  This EUA will remain in effect (meaning this tes t can be used) for the duration of  the COVID-19 declaration under Section 564(b)(1) of the Act, 21 U.S.C. section 360-bbb-3(b)(1), unless the authorization is terminated or revoked sooner.  Performed at [facility], [address] [place], [place] [address]   Blood Culture (routine x 2)     Status: None   Collection Time: 10/23/2019  6:01 PM   Specimen: BLOOD  Result Value Ref Range Status    Specimen Description BLOOD RH  Final   Special Requests   Final    BOTTLES DRAWN AEROBIC AND  ANAEROBIC Blood Culture adequate volume   Culture   Final    NO GROWTH 5 DAYS Performed at [facility], [address] [place], [place] [address]    Report Status 10/27/2019 FINAL  Final  MRSA PCR Screening     Status: None   Collection Time: 10/23/19  7:43 AM   Specimen: Nasal Mucosa; Nasopharyngeal  Result Value Ref Range Status   MRSA by PCR NEGATIVE NEGATIVE Final    Comment:        The GeneXpert MRSA Assay (FDA approved for NASAL specimens only), is one component of a comprehensive MRSA colonization surveillance program. It is not intended to diagnose MRSA infection nor to guide or monitor treatment for MRSA infections. Performed at [facility], [address] [place], [place] [address]      Coagulation Studies: Recent Labs    10/27/19 0555 10/28/19 0341 10/29/19 0600  LABPROT 73.4* 22.7* 31.7*  INR 9.4* 2.1* 3.2*    Urinalysis: No results for input(s): COLORURINE, LABSPEC, PHURINE, GLUCOSEU, HGBUR, BILIRUBINUR, KETONESUR, PROTEINUR, UROBILINOGEN, NITRITE, LEUKOCYTESUR in the last 72 hours.  Invalid input(s): APPERANCEUR      Imaging: DG Abd Portable 1V  Result Date: 10/28/2019 CLINICAL DATA:  Feeding tube placement EXAM: PORTABLE ABDOMEN - 1 VIEW COMPARISON:  None. FINDINGS: Feeding tube tip is in the proximal stomach. There is moderate stool in the colon. There is no bowel dilatation or air-fluid level to suggest bowel obstruction. No free air. Visualized lung bases clear. IMPRESSION: Feeding tube tip in proximal stomach. No bowel obstruction or free air. Electronically Signed   By: Bretta Bang III M.D.   On: 10/28/2019 10:34     Assessment & Plan: Pt is a 59 y.o. African-American  male with Medical problems of diabetes hypertension and obesity , was admitted on 10/18/2019 with Acute respiratory failure with hypoxia (HCC) [J96.01] Acute  respiratory failure due to COVID-19 (HCC) [U07.1, J96.00] Pneumonia due to COVID-19 virus [U07.1, J12.82] , was on non invasive ventilation initially, but required mechanical ventilation on 10/26/19 due to severe ARDS. Currently on FIO2 60%.   # Acute renal failure Patient sustained multiorgan failure, including renal failure. Oliguric with very minimal dark tea colored urine draining via F/C.  CRRT started 10/27/2019 Continue to 2k/2.5 ca solution Prefilter 300 cc/h Dialysate 1500 cc/h Post filter 300 cc/hr   #Acute respiratory failure, secondary to COVID-19 pneumonia Currently ventilator dependent Management as per ICU team  #Hyperkalemia Will use low potassium bath for CRRT Improving  #Severe hyperphosphatemia Rhabdomyolysis suspected, CK elevated to 1900    #Shock liver INR elevated but improving   With multiple severe comorbidities, overall prognosis appears poor   LOS: 7 Jael Kostick 8/21/20218:58 AM    Note: This note was prepared with Dragon dictation. Any transcription errors are unintentional

## 2019-10-29 NOTE — Progress Notes (Signed)
CRITICAL CARE NOTE  59 year old male with PMH significant for uncontrolled type 2 DM, HLD, HTN, Thyroid nodule, gout, morbid obesity and pulmonary embolism presented to [facility] ER on 08/14 via EMS from home with generalized malaise, shortness of breath and chest discomfort with inspiration. Admitted with acute hypoxic respiratory failure secondary to COVID-19 Pneumonia. On high flow O2 alternating with BiPAP  SIGNIFICANT EVENTS 08/14: Pt admitted tostepdown unit with COVID pneumonia 08/16:Remains housed in EDP due to no rooms. Still stepdown status, remains BiPAP dependent 8/17remains on biPAP, on precedex 8/18Intubated, sedated, severe ARDS 8/71multiorgan failure, progressive hypoxia and shock a with renal failure, started CRRT 8/20 remains in multiorgan failure, on pressors, on CRRT    STUDIES: 8/14:Chest XrayshowsInterval development of patchy bilateral airspace opacities which may represent pneumonia 8/14: CTA Chest Slightly suboptimal opacification of the main pulmonary artery, however no central or proximal segmental pulmonary embolism.Extensive multifocal airspace opacities, consistent with atypical viral pneumonia  CULTURES: 8/14:BCxpending  ANTIBIOTICS: Ceftriaxone 8/14> Azithromycin8/14>  CC  follow up respiratory failure  SUBJECTIVE Patient remains critically ill Prognosis is guarded Severe hypoxia On CRRT   BP 104/72   Pulse (!) 105   Temp 97.8 F (36.6 C) (Axillary)   Resp (!) 33   Ht 6\' 3"  (1.905 m)   Wt (!) 143.6 kg   SpO2 97%   BMI 39.57 kg/m    I/O last 3 completed shifts: In: 3554.6 [I.V.:3104.5; NG/GT:100; IV Piggyback:350] Out: 3700.7 [Other:3700.7] No intake/output data recorded.  SpO2: 97 % O2 Flow Rate (L/min): 50 L/min FiO2 (%): 50 %  Estimated body mass index is 39.57 kg/m as calculated from the following:   Height as of this encounter: 6\' 3"  (1.905 m).   Weight as of this encounter: 143.6 kg.  SIGNIFICANT  EVENTS   REVIEW OF SYSTEMS  PATIENT IS UNABLE TO PROVIDE COMPLETE REVIEW OF SYSTEMS DUE TO SEVERE CRITICAL ILLNESS      COVID-19 DISASTER DECLARATION:   FULL CONTACT PHYSICAL EXAMINATION WAS NOT POSSIBLE DUE TO TREATMENT OF COVID-19  AND CONSERVATION OF PERSONAL PROTECTIVE EQUIPMENT, LIMITED EXAM FINDINGS INCLUDE-   PHYSICAL EXAMINATION:  GENERAL:critically ill appearing, +resp distress NEUROLOGIC: obtunded, GCS<8   Patient assessed or the symptoms described in the history of present illness.  In the context of the Global COVID-19 pandemic, which necessitated consideration that the patient might be at risk for infection with the SARS-CoV-2 virus that causes COVID-19, Institutional protocols and algorithms that pertain to the evaluation of patients at risk for COVID-19 are in a state of rapid change based on information released by regulatory bodies including the CDC and federal and state organizations. These policies and algorithms were followed during the patient's care while in hospital.    MEDICATIONS: I have reviewed all medications and confirmed regimen as documented   CULTURE RESULTS   Recent Results (from the past 240 hour(s))  Blood Culture (routine x 2)     Status: None   Collection Time: 10/27/2019  4:55 PM   Specimen: BLOOD  Result Value Ref Range Status   Specimen Description BLOOD BLOOD LEFT FOREARM  Final   Special Requests   Final    BOTTLES DRAWN AEROBIC AND ANAEROBIC Blood Culture results may not be optimal due to an excessive volume of blood received in culture bottles   Culture   Final    NO GROWTH 5 DAYS Performed at [facility], [address], [place], [address] [place]    Report Status 10/27/2019 FINAL  Final  SARS Coronavirus 2  by RT PCR (hospital order, performed in [facility] hospital lab) Nasopharyngeal Nasopharyngeal Swab     Status: Abnormal   Collection Time: 10/13/2019  5:03 PM   Specimen: Nasopharyngeal Swab  Result Value Ref  Range Status   SARS Coronavirus 2 POSITIVE (A) NEGATIVE Final    Comment: RESULT CALLED TO, READ BACK BY AND VERIFIED WITH: APRIL BRUMGARD 11/08/2019 AT 2027 HS (NOTE) SARS-CoV-2 target nucleic acids are DETECTED  SARS-CoV-2 RNA is generally detectable in upper respiratory specimens  during the acute phase of infection.  Positive results are indicative  of the presence of the identified virus, but do not rule out bacterial infection or co-infection with other pathogens not detected by the test.  Clinical correlation with patient history and  other diagnostic information is necessary to determine patient infection status.  The expected result is negative.  Fact Sheet for Patients:   BoilerBrush.com.cy   Fact Sheet for Healthcare Providers:   https://pope.com/    This test is not yet approved or cleared by the [place] FDA and  has been authorized for detection and/or diagnosis of SARS-CoV-2 by FDA under an Emergency Use Authorization (EUA).  This EUA will remain in effect (meaning this tes t can be used) for the duration of  the COVID-19 declaration under Section 564(b)(1) of the Act, 21 U.S.C. section 360-bbb-3(b)(1), unless the authorization is terminated or revoked sooner.  Performed at [facility], [address] [place], [place] [address]   Blood Culture (routine x 2)     Status: None   Collection Time: 11/05/2019  6:01 PM   Specimen: BLOOD  Result Value Ref Range Status   Specimen Description BLOOD RH  Final   Special Requests   Final    BOTTLES DRAWN AEROBIC AND ANAEROBIC Blood Culture adequate volume   Culture   Final    NO GROWTH 5 DAYS Performed at [facility], [address] [place], [place] [address]    Report Status 10/27/2019 FINAL  Final  MRSA PCR Screening     Status: None   Collection Time: 10/23/19  7:43 AM   Specimen: Nasal Mucosa; Nasopharyngeal  Result Value Ref Range Status    MRSA by PCR NEGATIVE NEGATIVE Final    Comment:        The GeneXpert MRSA Assay (FDA approved for NASAL specimens only), is one component of a comprehensive MRSA colonization surveillance program. It is not intended to diagnose MRSA infection nor to guide or monitor treatment for MRSA infections. Performed at [facility], [address] [place], [place] [address]           IMAGING    DG Abd Portable 1V  Result Date: 10/28/2019 CLINICAL DATA:  Feeding tube placement EXAM: PORTABLE ABDOMEN - 1 VIEW COMPARISON:  None. FINDINGS: Feeding tube tip is in the proximal stomach. There is moderate stool in the colon. There is no bowel dilatation or air-fluid level to suggest bowel obstruction. No free air. Visualized lung bases clear. IMPRESSION: Feeding tube tip in proximal stomach. No bowel obstruction or free air. Electronically Signed   By: Bretta Bang III M.D.   On: 10/28/2019 10:34     Nutrition Status: Nutrition Problem: Inadequate oral intake Etiology: inability to eat Signs/Symptoms: NPO status Interventions: Refer to RD note for recommendations     Indwelling Urinary Catheter continued, requirement due to   Reason to continue Indwelling Urinary Catheter strict Intake/Output monitoring for hemodynamic instability   Central Line/ continued, requirement due  to  Reason to continue [employer] of central venous pressure or other hemodynamic parameters and poor IV access   Ventilator continued, requirement due to severe respiratory failure   Ventilator Sedation RASS 0 to -2      ASSESSMENT AND PLAN SYNOPSIS  Acute hypoxemic respiratory failure due to COVID-19 pneumonia / ARDS Mechanical ventilation via ARDS protocol, target PRVC 6 cc/kg Wean PEEP and FiO2 as able Goal plateau pressure less than 30, driving pressure less than 15 Paralytics if necessary for vent synchrony, gas exchange Cycle prone positioning if necessary for  oxygenation Deep sedation per PAD protocol, goal RASS -4, currently fentanyl, midazolam Diuresis as blood pressure and renal function can tolerate, goal CVP 5-8.   diuresis as tolerated based on Kidney function VAP prevention order set Remdesivir +BARIC IV STEROIDS  Follow inflammatory markers: Ferritin, D-dimer, CRP, IL-6, LDH Vitamin C, zinc Plan to repeat and check resp cultures    Severe ACUTE Hypoxic and Hypercapnic Respiratory Failure -continue Full MV support -continue Bronchodilator Therapy -Wean Fio2 and PEEP as tolerated -VAP/VENT bundle implementation  ACUTE DAISTOLIC CARDIAC FAILURE- vent support  Morbid obesity, possible OSA.   Will certainly impact respiratory mechanics, ventilator weaning Suspect will need to consider additional PEEP  ACUTE KIDNEY INJURY/Renal Failure -continue Foley Catheter-assess need -Avoid nephrotoxic agents -Follow urine output, BMP -Ensure adequate renal perfusion, optimize oxygenation -Renal dose medications On CRRT     NEUROLOGY Acute toxic metabolic encephalopathy, need for sedation Goal RASS -2 to -3  SHOCK-SEPSIS/HYPOVOLUMIC/CARDIOGENIC -use vasopressors to keep MAP>65   CARDIAC ICU monitoring  ID -continue IV abx as prescibed -follow up cultures  GI GI PROPHYLAXIS as indicated   DIET-->TF's as tolerated Constipation protocol as indicated  ENDO - will use ICU hypoglycemic\Hyperglycemia protocol if indicated     ELECTROLYTES -follow labs as needed -replace as needed -pharmacy consultation and following   DVT/GI PRX ordered and assessed TRANSFUSIONS AS NEEDED MONITOR FSBS I Assessed the need for Labs I Assessed the need for Foley I Assessed the need for Central Venous Line Family Discussion when available I Assessed the need for Mobilization I made an Assessment of medications to be adjusted accordingly Safety Risk assessment completed   CASE DISCUSSED IN MULTIDISCIPLINARY ROUNDS WITH ICU  TEAM  Critical Care Time devoted to patient care services described in this note is 35 minutes.   Overall, patient is critically ill, prognosis is guarded.  Patient with Multiorgan failure and at high risk for cardiac arrest and death.    Lucie Leather, M.D.  [facility]  Medical Director [facility] Medical Director [facility] Cardio-Pulmonary Department

## 2019-10-29 NOTE — Progress Notes (Signed)
Assisted family with tele-visit via elink 

## 2019-10-29 NOTE — Progress Notes (Signed)
Uneventful night with patient remaining sedated on vent. Patient has tolerated Levophed weaning overnight, currently at with BP WDL. Sedation also weaned to 168mcg/hr of fentanyl and 1.5mg  of versed. Patients PERL, but patient is not responsive to painful stimuli at this time. CRRT filter changed x1 overnight. Will continue to monitor and assess for changes.

## 2019-10-29 NOTE — Progress Notes (Signed)
Assisted tele visit to patient with family member.  Ashlynne Shetterly M, RN  

## 2019-10-29 NOTE — Progress Notes (Signed)
Assisted tele visit to patient with family member.  Zeanna Sunde M, RN  

## 2019-10-30 ENCOUNTER — Inpatient Hospital Stay: Payer: 59

## 2019-10-30 LAB — CBC
HCT: 24.9 % — ABNORMAL LOW (ref 39.0–52.0)
Hemoglobin: 9 g/dL — ABNORMAL LOW (ref 13.0–17.0)
MCH: 27.2 pg (ref 26.0–34.0)
MCHC: 36.1 g/dL — ABNORMAL HIGH (ref 30.0–36.0)
MCV: 75.2 fL — ABNORMAL LOW (ref 80.0–100.0)
Platelets: 167 10*3/uL (ref 150–400)
RBC: 3.31 MIL/uL — ABNORMAL LOW (ref 4.22–5.81)
RDW: 14.8 % (ref 11.5–15.5)
WBC: 24 10*3/uL — ABNORMAL HIGH (ref 4.0–10.5)
nRBC: 1.4 % — ABNORMAL HIGH (ref 0.0–0.2)

## 2019-10-30 LAB — RENAL FUNCTION PANEL
Albumin: 2.4 g/dL — ABNORMAL LOW (ref 3.5–5.0)
Anion gap: 11 (ref 5–15)
BUN: 82 mg/dL — ABNORMAL HIGH (ref 6–20)
CO2: 25 mmol/L (ref 22–32)
Calcium: 8.1 mg/dL — ABNORMAL LOW (ref 8.9–10.3)
Chloride: 102 mmol/L (ref 98–111)
Creatinine, Ser: 4.9 mg/dL — ABNORMAL HIGH (ref 0.61–1.24)
GFR calc Af Amer: 14 mL/min — ABNORMAL LOW (ref >60)
GFR calc non Af Amer: 12 mL/min — ABNORMAL LOW (ref >60)
Glucose, Bld: 218 mg/dL — ABNORMAL HIGH (ref 70–99)
Phosphorus: 3.8 mg/dL (ref 2.5–4.6)
Potassium: 3.7 mmol/L (ref 3.5–5.1)
Sodium: 138 mmol/L (ref 135–145)

## 2019-10-30 LAB — GLUCOSE, CAPILLARY
Glucose-Capillary: 128 mg/dL — ABNORMAL HIGH (ref 70–99)
Glucose-Capillary: 156 mg/dL — ABNORMAL HIGH (ref 70–99)
Glucose-Capillary: 161 mg/dL — ABNORMAL HIGH (ref 70–99)
Glucose-Capillary: 170 mg/dL — ABNORMAL HIGH (ref 70–99)
Glucose-Capillary: 177 mg/dL — ABNORMAL HIGH (ref 70–99)
Glucose-Capillary: 182 mg/dL — ABNORMAL HIGH (ref 70–99)
Glucose-Capillary: 196 mg/dL — ABNORMAL HIGH (ref 70–99)
Glucose-Capillary: 198 mg/dL — ABNORMAL HIGH (ref 70–99)
Glucose-Capillary: 225 mg/dL — ABNORMAL HIGH (ref 70–99)
Glucose-Capillary: 227 mg/dL — ABNORMAL HIGH (ref 70–99)
Glucose-Capillary: 232 mg/dL — ABNORMAL HIGH (ref 70–99)
Glucose-Capillary: 264 mg/dL — ABNORMAL HIGH (ref 70–99)

## 2019-10-30 LAB — BASIC METABOLIC PANEL
Anion gap: 15 (ref 5–15)
BUN: 84 mg/dL — ABNORMAL HIGH (ref 6–20)
CO2: 22 mmol/L (ref 22–32)
Calcium: 8.2 mg/dL — ABNORMAL LOW (ref 8.9–10.3)
Chloride: 102 mmol/L (ref 98–111)
Creatinine, Ser: 6.17 mg/dL — ABNORMAL HIGH (ref 0.61–1.24)
GFR calc Af Amer: 11 mL/min — ABNORMAL LOW (ref >60)
GFR calc non Af Amer: 9 mL/min — ABNORMAL LOW (ref >60)
Glucose, Bld: 212 mg/dL — ABNORMAL HIGH (ref 70–99)
Potassium: 3.9 mmol/L (ref 3.5–5.1)
Sodium: 139 mmol/L (ref 135–145)

## 2019-10-30 LAB — PROTIME-INR
INR: 3.2 — ABNORMAL HIGH (ref 0.8–1.2)
Prothrombin Time: 32 seconds — ABNORMAL HIGH (ref 11.4–15.2)

## 2019-10-30 LAB — TROPONIN I (HIGH SENSITIVITY)
Troponin I (High Sensitivity): 40 ng/L — ABNORMAL HIGH
Troponin I (High Sensitivity): 40 ng/L — ABNORMAL HIGH (ref <18)

## 2019-10-30 LAB — MAGNESIUM
Magnesium: 2.8 mg/dL — ABNORMAL HIGH (ref 1.7–2.4)
Magnesium: 2.6 mg/dL — ABNORMAL HIGH (ref 1.7–2.4)

## 2019-10-30 LAB — PHOSPHORUS: Phosphorus: 4.6 mg/dL (ref 2.5–4.6)

## 2019-10-30 MED ORDER — MIDAZOLAM HCL 2 MG/2ML IJ SOLN
INTRAMUSCULAR | Status: AC
  Administered 2019-10-30: 2 mg via INTRAVENOUS
  Filled 2019-10-30: qty 2

## 2019-10-30 MED ORDER — INSULIN ASPART 100 UNIT/ML ~~LOC~~ SOLN
0.0000 [IU] | SUBCUTANEOUS | Status: DC
  Administered 2019-10-30: 11 [IU] via SUBCUTANEOUS
  Administered 2019-10-30 (×2): 4 [IU] via SUBCUTANEOUS
  Administered 2019-10-31: 15 [IU] via SUBCUTANEOUS
  Administered 2019-10-31: 20 [IU] via SUBCUTANEOUS
  Administered 2019-10-31 (×3): 15 [IU] via SUBCUTANEOUS
  Administered 2019-10-31 (×2): 20 [IU] via SUBCUTANEOUS
  Filled 2019-10-30 (×11): qty 1

## 2019-10-30 MED ORDER — STERILE WATER FOR INJECTION IJ SOLN
INTRAMUSCULAR | Status: AC
  Administered 2019-10-30: 10 mL
  Filled 2019-10-30: qty 10

## 2019-10-30 MED ORDER — FENTANYL CITRATE (PF) 100 MCG/2ML IJ SOLN
INTRAMUSCULAR | Status: AC
  Administered 2019-10-30: 50 ug via INTRAVENOUS
  Filled 2019-10-30: qty 2

## 2019-10-30 MED ORDER — PROPOFOL 1000 MG/100ML IV EMUL
0.0000 ug/kg/min | INTRAVENOUS | Status: DC
  Administered 2019-10-30 – 2019-10-31 (×2): 20 ug/kg/min via INTRAVENOUS
  Administered 2019-10-31 (×3): 25 ug/kg/min via INTRAVENOUS
  Administered 2019-10-31: 40 ug/kg/min via INTRAVENOUS
  Administered 2019-11-01 (×2): 25 ug/kg/min via INTRAVENOUS
  Filled 2019-10-30 (×8): qty 100

## 2019-10-30 MED ORDER — MIDAZOLAM HCL 2 MG/2ML IJ SOLN
2.0000 mg | Freq: Once | INTRAMUSCULAR | Status: DC | PRN
  Filled 2019-10-30: qty 2

## 2019-10-30 MED ORDER — PRISMASOL BGK 4/2.5 32-4-2.5 MEQ/L REPLACEMENT SOLN
Status: DC
  Filled 2019-10-30: qty 5000

## 2019-10-30 MED ORDER — ENSURE ENLIVE PO LIQD
90.0000 mL | Freq: Two times a day (BID) | ORAL | Status: DC
  Administered 2019-10-30 – 2019-11-01 (×6): 90 mL
  Filled 2019-10-30: qty 90

## 2019-10-30 MED ORDER — INSULIN REGULAR(HUMAN) IN NACL 100-0.9 UT/100ML-% IV SOLN
INTRAVENOUS | Status: DC
  Administered 2019-10-30: 8 [IU]/h via INTRAVENOUS
  Filled 2019-10-30: qty 100

## 2019-10-30 MED ORDER — FENTANYL CITRATE (PF) 100 MCG/2ML IJ SOLN
50.0000 ug | INTRAMUSCULAR | Status: DC | PRN
  Administered 2019-10-30 – 2019-10-31 (×8): 50 ug via INTRAVENOUS
  Filled 2019-10-30 (×8): qty 2

## 2019-10-30 MED ORDER — MIDAZOLAM HCL 2 MG/2ML IJ SOLN: 2.0000 mg | INTRAMUSCULAR | Status: DC | PRN

## 2019-10-30 MED ORDER — DEXTROSE 50 % IV SOLN: 0.0000 mL | INTRAVENOUS | Status: DC | PRN

## 2019-10-30 MED ORDER — FENTANYL CITRATE (PF) 100 MCG/2ML IJ SOLN: 50.0000 ug | INTRAMUSCULAR | Status: DC | PRN

## 2019-10-30 MED ORDER — PRISMASOL BGK 4/2.5 32-4-2.5 MEQ/L IV SOLN
INTRAVENOUS | Status: DC
  Filled 2019-10-30: qty 5000

## 2019-10-30 MED ORDER — PIVOT 1.5 CAL PO LIQD
1000.0000 mL | ORAL | Status: DC
  Administered 2019-10-30 – 2019-10-31 (×2): 1000 mL
  Filled 2019-10-30: qty 1000

## 2019-10-30 MED ORDER — MIDAZOLAM HCL 2 MG/2ML IJ SOLN
2.0000 mg | INTRAMUSCULAR | Status: DC | PRN
  Administered 2019-10-30 (×3): 2 mg via INTRAVENOUS
  Filled 2019-10-30 (×3): qty 2

## 2019-10-30 NOTE — Plan of Care (Addendum)
Pt with weak cough/gag reflex, corneals remain impaired, moderate frank blood in oral cavity this AM, no muscle tension or other s/sx pain, CRRT restarted at 0800 this AM, was off 12 hrs overnite, change dialysate flow to 2000 ml/hr per Dr Thedore Mins.  MRI ordered for today, will start TF afterward per RD recommendations, Insulin gtt will stop at 1030.

## 2019-10-30 NOTE — Progress Notes (Signed)
Pt noted with RR in 40s, HR 135 at 1800.  Subsequently BP accelerated to 170s/80s.  Dr Belia Heman notified, several PRNs attempted.  RR improved, but pt remains tachycardic and hypertensive.  MD does not wish to restart continuous sedation at this time.  Ordered mag and troponins. Continue to monitor

## 2019-10-30 NOTE — Progress Notes (Signed)
[address] [place], [place] [address] Phone [phone]. Fax [phone]  Date: 10/30/2019                  Patient Name:  Spencer Woodward  MRN: [id number]  DOB: [birth year]  Age / Sex: 59 y.o., male         PCP: Revelo, Presley Raddle, MD                 Service Requesting Consult: IM/ Erin Fulling, MD                 Reason for Consult: ARF            History of Present Illness: Patient is a 59 y.o. male with medical problems of DM,HTN , Obesity, who was admitted to [facility] on 10/15/2019 for evaluation of SOB, generalized malaise for 2 days prior to the presentation, got diagnosed with COVID pneumonia.Initially patient was placed on BIPAP, but due to severe ARDS, required intubation and mechanical ventilation since 10/26/19. Patient deteriorated with multiorgan failure including renal failure,   Patient was started on CRRT on August 19. Tolerating fair so far. Dialyzer had to be changed few times. Meeting UF removal goal of net 0. Off pressors today (Sunday) Patient sedated with fentanyl and Versed- minimally responsive Continues to require ventilator support, fio2 40%, PEEP 12   Vital Signs: Blood pressure 107/76, pulse (!) 116, temperature 99 F (37.2 C), temperature source Oral, resp. rate (!) 25, height 6\' 3"  (1.905 m), weight (!) 143.9 kg, SpO2 95 %.   Intake/Output Summary (Last 24 hours) at 10/30/2019 0955 Last data filed at 10/30/2019 0900 Gross per 24 hour  Intake 1011.78 ml  Output 986 ml  Net 25.78 ml    Weight trends: Filed Weights   10/28/19 0332 10/29/19 0500 10/30/19 0500  Weight: (!) 145.4 kg (!) 143.6 kg (!) 143.9 kg    Physical Exam: General:  Critically ill appearing,intubated and sedated  HEENT  Sclera -non-icteric.ETT in place, NGT, conjunctival edema  Lungs:  vent dependent  Heart::  Regular, tachycardic  Abdomen: Soft,non distended  Extremities:  feet cool to touch  Neurologic:  Sedated   Skin:  no rashes or lesions noted  Access:  Left IJ temp cath  Foley: Oliguric with minimal dark tea colored urine . Foley cath       Lab results: Basic Metabolic Panel: Recent Labs  Lab 10/27/19 0555 10/27/19 1630 10/28/19 0341 10/28/19 0341 10/28/19 1617 10/28/19 1617 10/29/19 0913 10/29/19 2054 10/30/19 0358  NA 146*   < > 144   < > 141   < > 139 138 139  K 6.6*   < > 5.8*   < > 5.8*   < > 5.0 4.5 3.9  CL 109   < > 111   < > 107   < > 101 102 102  CO2 23   < > 21*   < > 22   < > 23 22 22   GLUCOSE 242*   < > 237*   < > 247*   < > 208* 235* 212*  BUN 66*   < > 74*   < > 73*   < > 66* 70* 84*  CREATININE 4.12*   < > 5.62*   < > 5.96*   < > 5.19* 5.30* 6.17*  CALCIUM 7.3*   < > 6.5*   < > 7.7*   < > 8.4* 8.3* 8.2*  MG 3.3*  --  2.6*  --   --   --   --   --  2.8*  PHOS 9.0*   < > 7.8*   < > 7.6*  --   --  5.6* 4.6   < > = values in this interval not displayed.    Liver Function Tests: Recent Labs  Lab 10/28/19 0341 10/28/19 1617 10/29/19 2054  AST 96*  --   --   ALT 109*  --   --   ALKPHOS 95  --   --   BILITOT 1.6*  --   --   PROT 5.9*  --   --   ALBUMIN 2.2*   < > 2.3*   < > = values in this interval not displayed.   No results for input(s): LIPASE, AMYLASE in the last 168 hours. No results for input(s): AMMONIA in the last 168 hours.  CBC: Recent Labs  Lab 10/28/19 0341 10/28/19 0341 10/29/19 0600 10/30/19 0358  WBC 19.2*   < > 24.1* 24.0*  NEUTROABS 16.8*  --  21.1*  --   HGB 10.4*   < > 9.9* 9.0*  HCT 31.2*   < > 28.8* 24.9*  MCV 79.8*   < > 78.9* 75.2*  PLT 174   < > 138* 167   < > = values in this interval not displayed.    Cardiac Enzymes: Recent Labs  Lab 10/27/19 1630  CKTOTAL 1,935*    BNP: Invalid input(s): POCBNP  CBG: Recent Labs  Lab 10/30/19 0357 10/30/19 0507 10/30/19 0616 10/30/19 0727 10/30/19 0849  GLUCAP 198* 196* 177* 161* 156*    Microbiology: Recent Results (from the past 720 hour(s))  Blood Culture (routine x 2)     Status: None   Collection Time:  10-28-2019  4:55 PM   Specimen: BLOOD  Result Value Ref Range Status   Specimen Description BLOOD BLOOD LEFT FOREARM  Final   Special Requests   Final    BOTTLES DRAWN AEROBIC AND ANAEROBIC Blood Culture results may not be optimal due to an excessive volume of blood received in culture bottles   Culture   Final    NO GROWTH 5 DAYS Performed at [facility], [address] [place], [place] [address]    Report Status 10/27/2019 FINAL  Final  SARS Coronavirus 2 by RT PCR (hospital order, performed in [facility] hospital lab) Nasopharyngeal Nasopharyngeal Swab     Status: Abnormal   Collection Time: October 28, 2019  5:03 PM   Specimen: Nasopharyngeal Swab  Result Value Ref Range Status   SARS Coronavirus 2 POSITIVE (A) NEGATIVE Final    Comment: RESULT CALLED TO, READ BACK BY AND VERIFIED WITH: APRIL BRUMGARD Oct 28, 2019 AT 2027 HS (NOTE) SARS-CoV-2 target nucleic acids are DETECTED  SARS-CoV-2 RNA is generally detectable in upper respiratory specimens  during the acute phase of infection.  Positive results are indicative  of the presence of the identified virus, but do not rule out bacterial infection or co-infection with other pathogens not detected by the test.  Clinical correlation with patient history and  other diagnostic information is necessary to determine patient infection status.  The expected result is negative.  Fact Sheet for Patients:   BoilerBrush.com.cy   Fact Sheet for Healthcare Providers:   https://pope.com/    This test is not yet approved or cleared by the [place] FDA and  has been authorized for detection and/or diagnosis of SARS-CoV-2 by FDA under an Emergency Use Authorization (EUA).  This EUA will remain in effect (meaning this tes t can be used) for the duration of  the COVID-19 declaration under Section 564(b)(1) of the Act, 21 U.S.C. section 360-bbb-3(b)(1), unless the authorization is terminated or  revoked sooner.  Performed at [facility], [address] [place], [place] [address]   Blood Culture (routine x 2)     Status: None   Collection Time: 11/01/2019  6:01 PM   Specimen: BLOOD  Result Value Ref Range Status   Specimen Description BLOOD RH  Final   Special Requests   Final    BOTTLES DRAWN AEROBIC AND ANAEROBIC Blood Culture adequate volume   Culture   Final    NO GROWTH 5 DAYS Performed at [facility], [address] [place], [place] [address]    Report Status 10/27/2019 FINAL  Final  MRSA PCR Screening     Status: None   Collection Time: 10/23/19  7:43 AM   Specimen: Nasal Mucosa; Nasopharyngeal  Result Value Ref Range Status   MRSA by PCR NEGATIVE NEGATIVE Final    Comment:        The GeneXpert MRSA Assay (FDA approved for NASAL specimens only), is one component of a comprehensive MRSA colonization surveillance program. It is not intended to diagnose MRSA infection nor to guide or monitor treatment for MRSA infections. Performed at [facility], [address] [place], [place] [address]      Coagulation Studies: Recent Labs    10/28/19 0341 10/29/19 0600 10/30/19 0358  LABPROT 22.7* 31.7* 32.0*  INR 2.1* 3.2* 3.2*    Urinalysis: No results for input(s): COLORURINE, LABSPEC, PHURINE, GLUCOSEU, HGBUR, BILIRUBINUR, KETONESUR, PROTEINUR, UROBILINOGEN, NITRITE, LEUKOCYTESUR in the last 72 hours.  Invalid input(s): APPERANCEUR      Imaging: DG Abd Portable 1V  Result Date: 10/28/2019 CLINICAL DATA:  Feeding tube placement EXAM: PORTABLE ABDOMEN - 1 VIEW COMPARISON:  None. FINDINGS: Feeding tube tip is in the proximal stomach. There is moderate stool in the colon. There is no bowel dilatation or air-fluid level to suggest bowel obstruction. No free air. Visualized lung bases clear. IMPRESSION: Feeding tube tip in proximal stomach. No bowel obstruction or free air. Electronically Signed   By: Bretta Bang III M.D.    On: 10/28/2019 10:34     Assessment & Plan: Pt is a 59 y.o. African-American  male with Medical problems of diabetes hypertension and obesity , was admitted on 11/06/2019 with Acute respiratory failure with hypoxia (HCC) [J96.01] Acute respiratory failure due to COVID-19 (HCC) [U07.1, J96.00] Pneumonia due to COVID-19 virus [U07.1, J12.82] , was on non invasive ventilation initially, but required mechanical ventilation on 10/26/19 due to severe ARDS. Currently on FIO2 60%.   # Acute renal failure Patient sustained multiorgan failure, including renal failure. Oliguric with very minimal dark tea colored urine draining via Foley catheter AKI likely secondary to ATN CRRT started 10/27/2019 Continue to 2k/2.5 ca solution Prefilter 300 cc/h Dialysate 2000 cc/h Post filter 300 cc/hr  Net UF rate 0 CRRT was interrupted last night due to technical issues with machine  #Acute respiratory failure, secondary to COVID-19 pneumonia Currently ventilator dependent Management as per ICU team  #Hyperkalemia Will use low potassium bath for CRRT Improving  #Severe hyperphosphatemia Rhabdomyolysis suspected, CK elevated to 1900   #Shock liver INR elevated    Patient is Jehovah's witness With multiple severe comorbidities, overall prognosis appears poor   LOS: 8 Belkis Norbeck 8/22/20219:55 AM    Note: This note was prepared with Dragon dictation. Any transcription errors are unintentional

## 2019-10-30 NOTE — Progress Notes (Signed)
Assisted tele visit to patient with family member.  Jessa Stinson M, RN  

## 2019-10-30 NOTE — Progress Notes (Signed)
CRITICAL CARE NOTE 59 year old male with PMH significant for uncontrolled type 2 DM, HLD, HTN, Thyroid nodule, gout, morbid obesity and pulmonary embolism presented to [facility] ER on 08/14 via EMS from home with generalized malaise, shortness of breath and chest discomfort with inspiration. Admitted with acute hypoxic respiratory failure secondary to COVID-19 Pneumonia. On high flow O2 alternating with BiPAP  SIGNIFICANT EVENTS 08/14: Pt admitted tostepdown unit with COVID pneumonia 08/16:Remains housed in EDP due to no rooms. Still stepdown status, remains BiPAP dependent 8/17remains on biPAP, on precedex 8/18Intubated, sedated, severe ARDS 8/67multiorgan failure, progressive hypoxia and shock a with renal failure, started CRRT 8/20 remains in multiorgan failure, on pressors, on CRRT 8/21 severe resp failure, fio2 at 50% 8/22 severe resp failure, on CRRT, no OUP   STUDIES: 8/14:Chest XrayshowsInterval development of patchy bilateral airspace opacities which may represent pneumonia 8/14: CTA Chest Slightly suboptimal opacification of the main pulmonary artery, however no central or proximal segmental pulmonary embolism.Extensive multifocal airspace opacities, consistent with atypical viral pneumonia  CULTURES: 8/14:BCxpending  ANTIBIOTICS: Ceftriaxone 8/14> Azithromycin8/14>   CC  follow up respiratory failure  SUBJECTIVE Patient remains critically ill Prognosis is guarded On CRRT On pressors Off sedation, poor neurological examination Will need MRI to assess for strokes  Vent Mode: PRVC FiO2 (%):  [40 %-50 %] 40 % Set Rate:  [30 bmp] 30 bmp Vt Set:  [550 mL] 550 mL PEEP:  [12 cmH20] 12 cmH20 Plateau Pressure:  [24 cmH20] 24 cmH20    BP 106/60   Pulse (!) 127   Temp (!) 100.4 F (38 C) (Rectal)   Resp (!) 24   Ht 6\' 3"  (1.905 m)   Wt (!) 143.9 kg   SpO2 95%   BMI 39.65 kg/m    I/O last 3 completed shifts: In: 2051.2 [I.V.:1536.1; Other:5;  NG/GT:160; IV Piggyback:350] Out: 2027 [Urine:150; Other:1877] No intake/output data recorded.  SpO2: 95 % O2 Flow Rate (L/min): 50 L/min FiO2 (%): 40 %  Estimated body mass index is 39.65 kg/m as calculated from the following:   Height as of this encounter: 6\' 3"  (1.905 m).   Weight as of this encounter: 143.9 kg.  SIGNIFICANT EVENTS   REVIEW OF SYSTEMS  PATIENT IS UNABLE TO PROVIDE COMPLETE REVIEW OF SYSTEMS DUE TO SEVERE CRITICAL ILLNESS      COVID-19 DISASTER DECLARATION:   FULL CONTACT PHYSICAL EXAMINATION WAS NOT POSSIBLE DUE TO TREATMENT OF COVID-19  AND CONSERVATION OF PERSONAL PROTECTIVE EQUIPMENT, LIMITED EXAM FINDINGS INCLUDE-   PHYSICAL EXAMINATION:  GENERAL:critically ill appearing, +resp distress NEUROLOGIC: obtunded, GCS<8   Patient assessed or the symptoms described in the history of present illness.  In the context of the Global COVID-19 pandemic, which necessitated consideration that the patient might be at risk for infection with the SARS-CoV-2 virus that causes COVID-19, Institutional protocols and algorithms that pertain to the evaluation of patients at risk for COVID-19 are in a state of rapid change based on information released by regulatory bodies including the CDC and federal and state organizations. These policies and algorithms were followed during the patient's care while in hospital.    MEDICATIONS: I have reviewed all medications and confirmed regimen as documented   CULTURE RESULTS   Recent Results (from the past 240 hour(s))  Blood Culture (routine x 2)     Status: None   Collection Time: 11/15/2019  4:55 PM   Specimen: BLOOD  Result Value Ref Range Status   Specimen Description BLOOD BLOOD LEFT FOREARM  Final  Special Requests   Final    BOTTLES DRAWN AEROBIC AND ANAEROBIC Blood Culture results may not be optimal due to an excessive volume of blood received in culture bottles   Culture   Final    NO GROWTH 5 DAYS Performed at  [facility], [address] [place], [place] [address]    Report Status 10/27/2019 FINAL  Final  SARS Coronavirus 2 by RT PCR (hospital order, performed in [facility] hospital lab) Nasopharyngeal Nasopharyngeal Swab     Status: Abnormal   Collection Time: 2019-11-15  5:03 PM   Specimen: Nasopharyngeal Swab  Result Value Ref Range Status   SARS Coronavirus 2 POSITIVE (A) NEGATIVE Final    Comment: RESULT CALLED TO, READ BACK BY AND VERIFIED WITH: APRIL BRUMGARD 15-Nov-2019 AT 2027 HS (NOTE) SARS-CoV-2 target nucleic acids are DETECTED  SARS-CoV-2 RNA is generally detectable in upper respiratory specimens  during the acute phase of infection.  Positive results are indicative  of the presence of the identified virus, but do not rule out bacterial infection or co-infection with other pathogens not detected by the test.  Clinical correlation with patient history and  other diagnostic information is necessary to determine patient infection status.  The expected result is negative.  Fact Sheet for Patients:   BoilerBrush.com.cy   Fact Sheet for Healthcare Providers:   https://pope.com/    This test is not yet approved or cleared by the [place] FDA and  has been authorized for detection and/or diagnosis of SARS-CoV-2 by FDA under an Emergency Use Authorization (EUA).  This EUA will remain in effect (meaning this tes t can be used) for the duration of  the COVID-19 declaration under Section 564(b)(1) of the Act, 21 U.S.C. section 360-bbb-3(b)(1), unless the authorization is terminated or revoked sooner.  Performed at [facility], [address] [place], [place] [address]   Blood Culture (routine x 2)     Status: None   Collection Time: 11-15-2019  6:01 PM   Specimen: BLOOD  Result Value Ref Range Status   Specimen Description BLOOD RH  Final   Special Requests   Final    BOTTLES DRAWN AEROBIC AND ANAEROBIC Blood  Culture adequate volume   Culture   Final    NO GROWTH 5 DAYS Performed at [facility], [address] [place], [place] [address]    Report Status 10/27/2019 FINAL  Final  MRSA PCR Screening     Status: None   Collection Time: 10/23/19  7:43 AM   Specimen: Nasal Mucosa; Nasopharyngeal  Result Value Ref Range Status   MRSA by PCR NEGATIVE NEGATIVE Final    Comment:        The GeneXpert MRSA Assay (FDA approved for NASAL specimens only), is one component of a comprehensive MRSA colonization surveillance program. It is not intended to diagnose MRSA infection nor to guide or monitor treatment for MRSA infections. Performed at [facility], [address] [place], [place] [address]           IMAGING    No results found.   Nutrition Status: Nutrition Problem: Inadequate oral intake Etiology: inability to eat Signs/Symptoms: NPO status Interventions: Refer to RD note for recommendations     Indwelling Urinary Catheter continued, requirement due to   Reason to continue Indwelling Urinary Catheter strict Intake/Output monitoring for hemodynamic instability   Central Line/ continued, requirement due to  Reason to continue [employer] Monitoring of central venous pressure or other hemodynamic parameters  and poor IV access   Ventilator continued, requirement due to severe respiratory failure   Ventilator Sedation RASS 0 to -2      ASSESSMENT AND PLAN SYNOPSIS  Acute hypoxemic respiratory failure due to COVID-19 pneumonia / ARDS Mechanical ventilation via ARDS protocol, target PRVC 6 cc/kg Wean PEEP and FiO2 as able Goal plateau pressure less than 30, driving pressure less than 15 Paralytics if necessary for vent synchrony, gas exchange Cycle prone positioning if necessary for oxygenation Deep sedation per PAD protocol, goal RASS -4, currently fentanyl, midazolam Diuresis as blood pressure and renal function can tolerate, goal CVP 5-8.    diuresis as tolerated based on Kidney function VAP prevention order set COVID THERAPY REM+BARIC IV STEROIDS  Vitamin C, zinc Plan to repeat and check resp cultures    Severe ACUTE Hypoxic and Hypercapnic Respiratory Failure -continue Full MV support -continue Bronchodilator Therapy -Wean Fio2 and PEEP as tolerated -VAP/VENT bundle implementation  ACUTE DIASTOLIC CARDIAC FAILURE-   Morbid obesity, possible OSA.   Will certainly impact respiratory mechanics, ventilator weaning Suspect will need to consider additional PEEP   ACUTE KIDNEY INJURY/Renal Failure -continue Foley Catheter-assess need -Avoid nephrotoxic agents -Follow urine output, BMP -Ensure adequate renal perfusion, optimize oxygenation -Renal dose medications On CRRT, no UOP     NEUROLOGY Acute toxic metabolic encephalopathy Off sedation Plan for MRI brian today  SHOCK-SEPSIS/HYPOVOLUMIC/CARDIOGENIC -use vasopressors to keep MAP>65   CARDIAC ICU monitoring  ID -continue IV abx as prescibed -follow up cultures  GI GI PROPHYLAXIS as indicated   DIET-->TF's as tolerated Constipation protocol as indicated  ENDO - will use ICU hypoglycemic\Hyperglycemia protocol if indicated     ELECTROLYTES -follow labs as needed -replace as needed -pharmacy consultation and following   DVT/GI PRX ordered and assessed TRANSFUSIONS AS NEEDED MONITOR FSBS I Assessed the need for Labs I Assessed the need for Foley I Assessed the need for Central Venous Line Family Discussion when available I Assessed the need for Mobilization I made an Assessment of medications to be adjusted accordingly Safety Risk assessment completed   CASE DISCUSSED IN MULTIDISCIPLINARY ROUNDS WITH ICU TEAM  Critical Care Time devoted to patient care services described in this note is 36 minutes.   Overall, patient is critically ill, prognosis is guarded.  Patient with Multiorgan failure and at high risk for cardiac arrest  and death.    Lucie Leather, M.D.  [facility]  Medical Director [facility] Medical Director [facility] Cardio-Pulmonary Department

## 2019-10-30 NOTE — Progress Notes (Signed)
CRRT paused 1105 for trip to MRI, rinseback per policy and system transitioned to recirculation, pt transported w/ RT.  CRRT restarted at 1230 w/o incident.  TF started.

## 2019-10-30 NOTE — Progress Notes (Signed)
ANTICOAGULATION CONSULT NOTE  Pharmacy Consult for Warfarin Dosing Indication: hx of  DVT  No Known Allergies  Patient Measurements: Height: 6\' 3"  (190.5 cm) Weight: (!) 143.9 kg (317 lb 3.9 oz) IBW/kg (Calculated) : 84.5  Vital Signs: Temp: 100.4 F (38 C) (08/22 0400) Temp Source: Rectal (08/22 0400) BP: 106/60 (08/22 0615) Pulse Rate: 127 (08/22 0615)  Labs: Recent Labs    10/27/19 1630 10/27/19 1630 10/28/19 0341 10/28/19 1617 10/29/19 0600 10/29/19 0913 10/29/19 2054 10/30/19 0358  HGB  --    < > 10.4*  --  9.9*  --   --  9.0*  HCT  --   --  31.2*  --  28.8*  --   --  24.9*  PLT  --   --  174  --  138*  --   --  167  LABPROT  --   --  22.7*  --  31.7*  --   --  32.0*  INR  --   --  2.1*  --  3.2*  --   --  3.2*  CREATININE 6.08*   < > 5.62*   < >  --  5.19* 5.30* 6.17*  CKTOTAL 1,935*  --   --   --   --   --   --   --    < > = values in this interval not displayed.    Estimated Creatinine Clearance: 20.2 mL/min (A) (by C-G formula based on SCr of 6.17 mg/dL (H)).  Medical History: Past Medical History:  Diagnosis Date  . Diabetes mellitus without complication [facility])     Assessment: 59 year old male with history of DM, HTN, and PE. Patient is COVID +. Patient on warfarin PTA for h/o PE several years ago. 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 He reports taking warfarin 11 mg daily.  Goal of Therapy:  INR 2-3 Monitor platelets by anticoagulation protocol: Yes   Date INR Dose  8/15 4.3 HOLD  8/16 4.6 HOLD  8/17 6.6 HOLD  8/18 7.3 HOLD  8/19 9.4 HOLD, vitK 10 mg  8/20 2.1 Hold  8/21 3.2 Hold  8/22 3.2 Hold       Plan:   8/20: Patient did have blood in mouth when RN suctioning. Per discussion on rounds, decision made to start patient on SQ heparin for VTE prophylaxis. Will not restart warfarin right now. Per previous MD note, patient had PE 6 years ago and was told to continue warfarin until  he changes his profession. Will avoid risk of restarting warfarin for now. Will continue to follow INR trend at least for the next few days.  8/22: INR 3.2.Holding Warfarin.on Heparin SQ. on CRRT. F/u INR in am (DDI= azithromycin/CTX)   9/22, PharmD 10/30/2019 7:15 AM

## 2019-10-30 NOTE — Progress Notes (Signed)
Assisted tele visit to patient with family member.  Amorette Charrette M, RN  

## 2019-10-30 NOTE — Progress Notes (Signed)
Assisted tele visit to patient with family member.  Theotis Gerdeman DNP elink RN 

## 2019-10-30 NOTE — Progress Notes (Signed)
Link sent to pt's brother for tele visit via elink

## 2019-10-31 ENCOUNTER — Inpatient Hospital Stay: Payer: 59

## 2019-10-31 LAB — CBC WITH DIFFERENTIAL/PLATELET
Abs Immature Granulocytes: 1.96 10*3/uL — ABNORMAL HIGH (ref 0.00–0.07)
Basophils Absolute: 0.1 10*3/uL (ref 0.0–0.1)
Basophils Relative: 0 %
Eosinophils Absolute: 0 10*3/uL (ref 0.0–0.5)
Eosinophils Relative: 0 %
HCT: 24.6 % — ABNORMAL LOW (ref 39.0–52.0)
Hemoglobin: 8.9 g/dL — ABNORMAL LOW (ref 13.0–17.0)
Immature Granulocytes: 6 %
Lymphocytes Relative: 2 %
Lymphs Abs: 0.7 10*3/uL (ref 0.7–4.0)
MCH: 27 pg (ref 26.0–34.0)
MCHC: 36.2 g/dL — ABNORMAL HIGH (ref 30.0–36.0)
MCV: 74.5 fL — ABNORMAL LOW (ref 80.0–100.0)
Monocytes Absolute: 2.7 10*3/uL — ABNORMAL HIGH (ref 0.1–1.0)
Monocytes Relative: 9 %
Neutro Abs: 25.3 10*3/uL — ABNORMAL HIGH (ref 1.7–7.7)
Neutrophils Relative %: 83 %
Platelets: 193 10*3/uL (ref 150–400)
RBC: 3.3 MIL/uL — ABNORMAL LOW (ref 4.22–5.81)
RDW: 14.6 % (ref 11.5–15.5)
WBC Morphology: INCREASED
WBC: 30.8 10*3/uL — ABNORMAL HIGH (ref 4.0–10.5)
nRBC: 0.5 % — ABNORMAL HIGH (ref 0.0–0.2)

## 2019-10-31 LAB — RENAL FUNCTION PANEL
Albumin: 2.2 g/dL — ABNORMAL LOW (ref 3.5–5.0)
Anion gap: 10 (ref 5–15)
BUN: 65 mg/dL — ABNORMAL HIGH (ref 6–20)
CO2: 24 mmol/L (ref 22–32)
Calcium: 7.1 mg/dL — ABNORMAL LOW (ref 8.9–10.3)
Chloride: 101 mmol/L (ref 98–111)
Creatinine, Ser: 3.63 mg/dL — ABNORMAL HIGH (ref 0.61–1.24)
GFR calc Af Amer: 20 mL/min — ABNORMAL LOW (ref >60)
GFR calc non Af Amer: 18 mL/min — ABNORMAL LOW (ref >60)
Glucose, Bld: 370 mg/dL — ABNORMAL HIGH (ref 70–99)
Phosphorus: 5.2 mg/dL — ABNORMAL HIGH (ref 2.5–4.6)
Potassium: 5.4 mmol/L — ABNORMAL HIGH (ref 3.5–5.1)
Sodium: 135 mmol/L (ref 135–145)

## 2019-10-31 LAB — TROPONIN I (HIGH SENSITIVITY): Troponin I (High Sensitivity): 45 ng/L — ABNORMAL HIGH (ref <18)

## 2019-10-31 LAB — BLOOD GAS, ARTERIAL
Acid-base deficit: 0.4 mmol/L (ref 0.0–2.0)
Bicarbonate: 24.1 mmol/L (ref 20.0–28.0)
FIO2: 0.4
MECHVT: 550 mL
O2 Saturation: 96 %
PEEP: 12 cmH2O
Patient temperature: 37
RATE: 30 resp/min
pCO2 arterial: 38 mmHg (ref 32.0–48.0)
pH, Arterial: 7.41 (ref 7.350–7.450)
pO2, Arterial: 81 mmHg — ABNORMAL LOW (ref 83.0–108.0)

## 2019-10-31 LAB — BASIC METABOLIC PANEL
Anion gap: 12 (ref 5–15)
BUN: 75 mg/dL — ABNORMAL HIGH (ref 6–20)
CO2: 22 mmol/L (ref 22–32)
Calcium: 7.8 mg/dL — ABNORMAL LOW (ref 8.9–10.3)
Chloride: 101 mmol/L (ref 98–111)
Creatinine, Ser: 4.2 mg/dL — ABNORMAL HIGH (ref 0.61–1.24)
GFR calc Af Amer: 17 mL/min — ABNORMAL LOW (ref >60)
GFR calc non Af Amer: 15 mL/min — ABNORMAL LOW (ref >60)
Glucose, Bld: 324 mg/dL — ABNORMAL HIGH (ref 70–99)
Potassium: 4.3 mmol/L (ref 3.5–5.1)
Sodium: 135 mmol/L (ref 135–145)

## 2019-10-31 LAB — GLUCOSE, CAPILLARY
Glucose-Capillary: 301 mg/dL — ABNORMAL HIGH (ref 70–99)
Glucose-Capillary: 325 mg/dL — ABNORMAL HIGH (ref 70–99)
Glucose-Capillary: 332 mg/dL — ABNORMAL HIGH (ref 70–99)
Glucose-Capillary: 334 mg/dL — ABNORMAL HIGH (ref 70–99)
Glucose-Capillary: 355 mg/dL — ABNORMAL HIGH (ref 70–99)
Glucose-Capillary: 360 mg/dL — ABNORMAL HIGH (ref 70–99)
Glucose-Capillary: 388 mg/dL — ABNORMAL HIGH (ref 70–99)

## 2019-10-31 LAB — PROTIME-INR
INR: 2.9 — ABNORMAL HIGH (ref 0.8–1.2)
Prothrombin Time: 29.4 seconds — ABNORMAL HIGH (ref 11.4–15.2)

## 2019-10-31 LAB — TRIGLYCERIDES: Triglycerides: 312 mg/dL — ABNORMAL HIGH (ref <150)

## 2019-10-31 LAB — POTASSIUM: Potassium: 5.4 mmol/L — ABNORMAL HIGH (ref 3.5–5.1)

## 2019-10-31 LAB — PROCALCITONIN: Procalcitonin: 6.17 ng/mL

## 2019-10-31 LAB — MAGNESIUM: Magnesium: 2.8 mg/dL — ABNORMAL HIGH (ref 1.7–2.4)

## 2019-10-31 MED ORDER — PHENYLEPHRINE CONCENTRATED 100MG/250ML (0.4 MG/ML) INFUSION SIMPLE
0.0000 ug/min | INTRAVENOUS | Status: DC
  Administered 2019-10-31: 50 ug/min via INTRAVENOUS
  Administered 2019-11-01: 20 ug/min via INTRAVENOUS
  Filled 2019-10-31 (×3): qty 250

## 2019-10-31 MED ORDER — INSULIN DETEMIR 100 UNIT/ML ~~LOC~~ SOLN
33.0000 [IU] | Freq: Two times a day (BID) | SUBCUTANEOUS | Status: DC
  Administered 2019-10-31: 33 [IU] via SUBCUTANEOUS
  Filled 2019-10-31 (×2): qty 0.33

## 2019-10-31 MED ORDER — INSULIN ASPART 100 UNIT/ML ~~LOC~~ SOLN
5.0000 [IU] | SUBCUTANEOUS | Status: DC
  Administered 2019-10-31 (×4): 5 [IU] via SUBCUTANEOUS
  Filled 2019-10-31 (×5): qty 1

## 2019-10-31 NOTE — Progress Notes (Signed)
Inpatient Diabetes Program Recommendations  AACE/ADA: New Consensus Statement on Inpatient Glycemic Control   Target Ranges:  Prepandial:   less than 140 mg/dL      Peak postprandial:   less than 180 mg/dL (1-2 hours)      Critically ill patients:  140 - 180 mg/dL   Results for NICHOLS, CORTER (MRN [id number]) as of 10/31/2019 09:06  Ref. Range 10/30/2019 08:49 10/30/2019 10:25 10/30/2019 13:09 10/30/2019 16:17 10/30/2019 20:36 10/31/2019 00:03 10/31/2019 03:58 10/31/2019 07:30  Glucose-Capillary Latest Ref Range: 70 - 99 mg/dL 144 (H) 818 (H) 563 (H) 182 (H) 264 (H) 301 (H) 334 (H) 332 (H)   Review of Glycemic Control  Current orders for Inpatient glycemic control: Levemir 29 units Q12H, Novolog 0-20 units Q4H; Solucortef 50 mg Q6H, Pivot @ 60 ml/hr  Inpatient Diabetes Program Recommendations:    Insulin-Basal: If steroids are continued as ordered, please consider increasing Levemir to 33 units Q12H.  Insulin- Tube Feeding: Please consider ordering Novolog 5 units Q4H for tube feeding coverage. If tube feeding is stopped or held then Novolog tube feeding coverage should also be stopped or held.  Thanks, Orlando Penner, RN, MSN, CDE Diabetes Coordinator Inpatient Diabetes Program [phone] (Team Pager from 8am to 5pm)

## 2019-10-31 NOTE — Progress Notes (Signed)
Call received from [facility]iology regarding patient's chest X-ray. Dr. Belia Heman notified of results.

## 2019-10-31 NOTE — Progress Notes (Addendum)
CRITICAL CARE NOTE  59 year old male with PMH significant for uncontrolled type 2 DM, HLD, HTN, Thyroid nodule, gout, morbid obesity and pulmonary embolism presented to [facility] ER on 08/14 via EMS from home with generalized malaise, shortness of breath and chest discomfort with inspiration. Admitted with acute hypoxic respiratory failure secondary to COVID-19 Pneumonia. On high flow O2 alternating with BiPAP  SIGNIFICANT EVENTS 08/14: Pt admitted tostepdown unit with COVID pneumonia 08/16:Remains housed in EDP due to no rooms. Still stepdown status, remains BiPAP dependent 8/17remains on biPAP, on precedex 8/18Intubated, sedated, severe ARDS 8/70multiorgan failure, progressive hypoxia and shock a with renal failure, started CRRT 8/20remains in multiorgan failure, on pressors, on CRRT 8/21 severe resp failure, fio2 at 50% 8/22 severe resp failure, on CRRT, no OUP +MRI shows CVA RT Cortex and B/L cerebellar  8/23 remains on vent, on CRRT, multiorgan failure, CXR small RT sided Apical PTX  STUDIES: 8/14:Chest XrayshowsInterval development of patchy bilateral airspace opacities which may represent pneumonia 8/14: CTA Chest Slightly suboptimal opacification of the main pulmonary artery, however no central or proximal segmental pulmonary embolism.Extensive multifocal airspace opacities, consistent with atypical viral pneumonia  CULTURES: 8/14:BCxpending  ANTIBIOTICS: Ceftriaxone 8/14>8/23 Azithromycin8/14>8/23  CC  follow up respiratory failure  SUBJECTIVE Patient remains critically ill Prognosis is guarded   BP 110/66   Pulse (!) 129   Temp 99.3 F (37.4 C)   Resp (!) 23   Ht $R'6\' 3"'BR$  (1.905 m)   Wt (!) 143.7 kg   SpO2 90%   BMI 39.60 kg/m    I/O last 3 completed shifts: In: 2224.1 [I.V.:539; NG/GT:1335; IV Piggyback:350.1] Out: 2151 [Urine:400; Other:1751] No intake/output data recorded.  SpO2: 90 % O2 Flow Rate (L/min): 50 L/min FiO2 (%): 40  %  Estimated body mass index is 39.6 kg/m as calculated from the following:   Height as of this encounter: $RemoveBeforeD'6\' 3"'hIMmgGpwDGpGcH$  (1.905 m).   Weight as of this encounter: 143.7 kg.  SIGNIFICANT EVENTS   REVIEW OF SYSTEMS  PATIENT IS UNABLE TO PROVIDE COMPLETE REVIEW OF SYSTEMS DUE TO SEVERE CRITICAL ILLNESS      COVID-19 DISASTER DECLARATION:   FULL CONTACT PHYSICAL EXAMINATION WAS NOT POSSIBLE DUE TO TREATMENT OF COVID-19  AND CONSERVATION OF PERSONAL PROTECTIVE EQUIPMENT, LIMITED EXAM FINDINGS INCLUDE-   PHYSICAL EXAMINATION:  GENERAL:critically ill appearing, +resp distress NEUROLOGIC: obtunded, GCS<8   Patient assessed or the symptoms described in the history of present illness.  In the context of the Global COVID-19 pandemic, which necessitated consideration that the patient might be at risk for infection with the SARS-CoV-2 virus that causes COVID-19, Institutional protocols and algorithms that pertain to the evaluation of patients at risk for COVID-19 are in a state of rapid change based on information released by regulatory bodies including the CDC and federal and state organizations. These policies and algorithms were followed during the patient's care while in hospital.    MEDICATIONS: I have reviewed all medications and confirmed regimen as documented   CULTURE RESULTS   Recent Results (from the past 240 hour(s))  Blood Culture (routine x 2)     Status: None   Collection Time: 10/31/2019  4:55 PM   Specimen: BLOOD  Result Value Ref Range Status   Specimen Description BLOOD BLOOD LEFT FOREARM  Final   Special Requests   Final    BOTTLES DRAWN AEROBIC AND ANAEROBIC Blood Culture results may not be optimal due to an excessive volume of blood received in culture bottles   Culture   Final  NO GROWTH 5 DAYS Performed at [facility], [address] [place], [place] [address]    Report Status 10/27/2019 FINAL  Final  SARS Coronavirus 2 by RT PCR (hospital order,  performed in [facility] hospital lab) Nasopharyngeal Nasopharyngeal Swab     Status: Abnormal   Collection Time: 10/21/2019  5:03 PM   Specimen: Nasopharyngeal Swab  Result Value Ref Range Status   SARS Coronavirus 2 POSITIVE (A) NEGATIVE Final    Comment: RESULT CALLED TO, READ BACK BY AND VERIFIED WITH: APRIL BRUMGARD 11/07/2019 AT 2027 HS (NOTE) SARS-CoV-2 target nucleic acids are DETECTED  SARS-CoV-2 RNA is generally detectable in upper respiratory specimens  during the acute phase of infection.  Positive results are indicative  of the presence of the identified virus, but do not rule out bacterial infection or co-infection with other pathogens not detected by the test.  Clinical correlation with patient history and  other diagnostic information is necessary to determine patient infection status.  The expected result is negative.  Fact Sheet for Patients:   StrictlyIdeas.no   Fact Sheet for Healthcare Providers:   BankingDealers.co.za    This test is not yet approved or cleared by the [place] FDA and  has been authorized for detection and/or diagnosis of SARS-CoV-2 by FDA under an Emergency Use Authorization (EUA).  This EUA will remain in effect (meaning this tes t can be used) for the duration of  the COVID-19 declaration under Section 564(b)(1) of the Act, 21 U.S.C. section 360-bbb-3(b)(1), unless the authorization is terminated or revoked sooner.  Performed at [facility], [address] [place], [place] [address]   Blood Culture (routine x 2)     Status: None   Collection Time: 11/01/2019  6:01 PM   Specimen: BLOOD  Result Value Ref Range Status   Specimen Description BLOOD RH  Final   Special Requests   Final    BOTTLES DRAWN AEROBIC AND ANAEROBIC Blood Culture adequate volume   Culture   Final    NO GROWTH 5 DAYS Performed at [facility], [address] [place], [place] [address]    Report  Status 10/27/2019 FINAL  Final  MRSA PCR Screening     Status: None   Collection Time: 10/23/19  7:43 AM   Specimen: Nasal Mucosa; Nasopharyngeal  Result Value Ref Range Status   MRSA by PCR NEGATIVE NEGATIVE Final    Comment:        The GeneXpert MRSA Assay (FDA approved for NASAL specimens only), is one component of a comprehensive MRSA colonization surveillance program. It is not intended to diagnose MRSA infection nor to guide or monitor treatment for MRSA infections. Performed at [facility], [address], [place], [place] [address]           IMAGING    MR BRAIN WO CONTRAST  Result Date: 10/30/2019 CLINICAL DATA:  Cerebral hemorrhage suspected. EXAM: MRI HEAD WITHOUT CONTRAST TECHNIQUE: Multiplanar, multiecho pulse sequences of the brain and surrounding structures were obtained without intravenous contrast. COMPARISON:  None. FINDINGS: Brain: Acute lacunar infarct at the right globus pallidus. Small and more subacute appearing infarct in the bilateral cerebellum. No pre-existing ischemic injury. Partially empty sella with sellar expansion, nonspecific in isolation. No hemorrhage, hydrocephalus, or masslike finding. Vascular: Slow or absent flow at the distal left V4 segment based on flow voids. Skull and upper cervical spine: Normal marrow signal Sinuses/Orbits: Nasopharyngeal and nasal cavity fluid with bilateral mastoid and middle ear opacification in  the setting of intubation. IMPRESSION: 1. Acute lacunar infarct at the right globus pallidus. 2. 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 3. No pre-existing ischemic injury. 4. Slow or absent flow at the distal left V4 segment. 5. Nasopharyngeal and bilateral mastoid fluid in the setting of intubation. Electronically Signed   By: Marnee Spring M.D.   On: 10/30/2019 13:04     Nutrition Status: Nutrition Problem: Inadequate oral intake Etiology: inability to eat Signs/Symptoms: NPO  status Interventions: Refer to RD note for recommendations     Indwelling Urinary Catheter continued, requirement due to   Reason to continue Indwelling Urinary Catheter strict Intake/Output monitoring for hemodynamic instability   Central Line/ continued, requirement due to  Reason to continue [employer] Monitoring of central venous pressure or other hemodynamic parameters and poor IV access   Ventilator continued, requirement due to severe respiratory failure   Ventilator Sedation RASS 0 to -2      ASSESSMENT AND PLAN SYNOPSIS  Acute hypoxemic respiratory failure due to COVID-19 pneumonia / ARDS Mechanical ventilation via ARDS protocol, target PRVC 6 cc/kg Wean PEEP and FiO2 as able Goal plateau pressure less than 30, driving pressure less than 15 Paralytics if necessary for vent synchrony, gas exchange Cycle prone positioning if necessary for oxygenation Deep sedation per PAD protocol, goal RASS -4, currently fentanyl, midazolam Diuresis as blood pressure and renal function can tolerate, goal CVP 5-8.   diuresis as tolerated based on Kidney function VAP prevention order set Remdesivir  IV STEROIDS  Follow inflammatory markers: Ferritin, D-dimer, CRP, IL-6, LDH Vitamin C, zinc Plan to repeat and check resp cultures    RT SMALL APICAL PTX Follow along with CXR   Severe ACUTE Hypoxic and Hypercapnic Respiratory Failure -continue Full MV support -continue Bronchodilator Therapy -Wean Fio2 and PEEP as tolerated -will perform SAT/SBT when respiratory parameters are met -VAP/VENT bundle implementation  ACUTE SYSTOLIC CARDIAC FAILURE- EF -oxygen as needed -Lasix as tolerated -follow up cardiac enzymes as indicated -follow up cardiology recs Continue CRRT   Morbid obesity, possible OSA.   Will certainly impact respiratory mechanics, ventilator weaning Suspect will need to consider additional PEEP, possible extubation to BiPAP when appropriate to  consider   ACUTE KIDNEY INJURY/Renal Failure -continue Foley Catheter-assess need -Avoid nephrotoxic agents -Follow urine output, BMP -Ensure adequate renal perfusion, optimize oxygenation -Renal dose medications     NEUROLOGY - intubated and sedated - minimal sedation to achieve a RASS goal: -1 Wake up assessment pending  Acute toxic metabolic encephalopathy, need for sedation Goal RASS -2 to -3  SHOCK-SEPSIS/HYPOVOLUMIC/CARDIOGENIC -use vasopressors to keep MAP>65 -follow ABG and LA -follow up cultures -emperic ABX -consider stress dose steroids -aggressive IV fluid resuscitation  CARDIAC ICU monitoring  ID Completed IV abx  -follow up cultures  GI GI PROPHYLAXIS as indicated   DIET-->TF's as tolerated Constipation protocol as indicated  ENDO - will use ICU hypoglycemic\Hyperglycemia protocol if indicated     ELECTROLYTES -follow labs as needed -replace as needed -pharmacy consultation and following   DVT/GI PRX ordered and assessed TRANSFUSIONS AS NEEDED MONITOR FSBS I Assessed the need for Labs I Assessed the need for Foley I Assessed the need for Central Venous Line Family Discussion when available I Assessed the need for Mobilization I made an Assessment of medications to be adjusted accordingly Safety Risk assessment completed   CASE DISCUSSED IN MULTIDISCIPLINARY ROUNDS WITH ICU TEAM  Critical Care Time devoted to patient care services described in this note is 34 minutes.  Overall, patient is critically ill, prognosis is guarded.  Patient with Multiorgan failure and at high risk for cardiac arrest and death.    Corrin Parker, M.D.  [facility]  Medical Director Dean Director [facility] Cardio-Pulmonary Department

## 2019-10-31 NOTE — Progress Notes (Signed)
[address] [place], [place] [address] Phone [phone]. Fax [phone]  Date: 10/31/2019                  Patient Name:  Spencer Woodward  MRN: [id number]  DOB: 10/23/1960  Age / Sex: 59 y.o., male         PCP: Revelo, Presley Raddle, MD                 Service Requesting Consult: IM/ Erin Fulling, MD                 Reason for Consult: ARF            History of Present Illness: Patient is a 59 y.o. male with medical problems of DM,HTN , Obesity, who was admitted to [facility] on 10/30/19 for evaluation of SOB, generalized malaise for 2 days prior to the presentation, got diagnosed with COVID pneumonia.Initially patient was placed on BIPAP, but due to severe ARDS, required intubation and mechanical ventilation since 10/26/19. Patient deteriorated with multiorgan failure including renal failure, started on CRRT on 10/27/2019.  Update:  Patient remains critically ill.  Urine output was only 250 cc over the preceding 24 hours.  Creatinine 4.2.  Still on the ventilator.   Vital Signs: Blood pressure 121/70, pulse (!) 129, temperature 99.1 F (37.3 C), temperature source Esophageal, resp. rate (!) 23, height 6\' 3"  (1.905 m), weight (!) 143.7 kg, SpO2 93 %.   Intake/Output Summary (Last 24 hours) at 10/31/2019 0831 Last data filed at 10/31/2019 0800 Gross per 24 hour  Intake 2058.9 ml  Output 2170 ml  Net -111.1 ml    Weight trends: Filed Weights   10/29/19 0500 10/30/19 0500 10/31/19 0500  Weight: (!) 143.6 kg (!) 143.9 kg (!) 143.7 kg    Physical Exam: General:  Critically ill appearing, intubated and sedated  HEENT  ETT in place, NGT, conjunctival edema  Lungs:  vent dependent  Heart::  Regular, tachycardic  Abdomen:  Nondistended  Extremities:  Trace edema  Neurologic:  Sedated   Skin:  no rashes or lesions noted  Access:  Left IJ temp cath  Foley:  Oliguric with minimal dark tea colored urine . Foley cath       Lab results: Basic Metabolic  Panel: Recent Labs  Lab 10/29/19 2054 10/29/19 2054 10/30/19 0358 10/30/19 1647 10/31/19 0402  NA 138   < > 139 138 135  K 4.5   < > 3.9 3.7 4.3  CL 102   < > 102 102 101  CO2 22   < > 22 25 22   GLUCOSE 235*   < > 212* 218* 324*  BUN 70*   < > 84* 82* 75*  CREATININE 5.30*   < > 6.17* 4.90* 4.20*  CALCIUM 8.3*   < > 8.2* 8.1* 7.8*  MG  --   --  2.8* 2.6* 2.8*  PHOS 5.6*  --  4.6 3.8  --    < > = values in this interval not displayed.    Liver Function Tests: Recent Labs  Lab 10/28/19 0341 10/28/19 1617 10/30/19 1647  AST 96*  --   --   ALT 109*  --   --   ALKPHOS 95  --   --   BILITOT 1.6*  --   --   PROT 5.9*  --   --   ALBUMIN 2.2*   < > 2.4*   < > = values in this interval not displayed.  No results for input(s): LIPASE, AMYLASE in the last 168 hours. No results for input(s): AMMONIA in the last 168 hours.  CBC: Recent Labs  Lab 10/29/19 0600 10/29/19 0600 10/30/19 0358 10/31/19 0402  WBC 24.1*   < > 24.0* 30.8*  NEUTROABS 21.1*  --   --  25.3*  HGB 9.9*   < > 9.0* 8.9*  HCT 28.8*   < > 24.9* 24.6*  MCV 78.9*   < > 75.2* 74.5*  PLT 138*   < > 167 193   < > = values in this interval not displayed.    Cardiac Enzymes: Recent Labs  Lab 10/27/19 1630  CKTOTAL 1,935*    BNP: Invalid input(s): POCBNP  CBG: Recent Labs  Lab 10/30/19 1617 10/30/19 2036 10/31/19 0003 10/31/19 0358 10/31/19 0730  GLUCAP 182* 264* 301* 334* 332*    Microbiology: Recent Results (from the past 720 hour(s))  Blood Culture (routine x 2)     Status: None   Collection Time: 12/20/19  4:55 PM   Specimen: BLOOD  Result Value Ref Range Status   Specimen Description BLOOD BLOOD LEFT FOREARM  Final   Special Requests   Final    BOTTLES DRAWN AEROBIC AND ANAEROBIC Blood Culture results may not be optimal due to an excessive volume of blood received in culture bottles   Culture   Final    NO GROWTH 5 DAYS Performed at [facility], [address]  [place], [place] [address]    Report Status 10/27/2019 FINAL  Final  SARS Coronavirus 2 by RT PCR (hospital order, performed in [facility] hospital lab) Nasopharyngeal Nasopharyngeal Swab     Status: Abnormal   Collection Time: 12/20/19  5:03 PM   Specimen: Nasopharyngeal Swab  Result Value Ref Range Status   SARS Coronavirus 2 POSITIVE (A) NEGATIVE Final    Comment: RESULT CALLED TO, READ BACK BY AND VERIFIED WITH: APRIL BRUMGARD 03-01-2020 AT 2027 HS (NOTE) SARS-CoV-2 target nucleic acids are DETECTED  SARS-CoV-2 RNA is generally detectable in upper respiratory specimens  during the acute phase of infection.  Positive results are indicative  of the presence of the identified virus, but do not rule out bacterial infection or co-infection with other pathogens not detected by the test.  Clinical correlation with patient history and  other diagnostic information is necessary to determine patient infection status.  The expected result is negative.  Fact Sheet for Patients:   BoilerBrush.com.cyhttps://www.fda.gov/media/136312/download   Fact Sheet for Healthcare Providers:   https://pope.com/https://www.fda.gov/media/136313/download    This test is not yet approved or cleared by the [place] FDA and  has been authorized for detection and/or diagnosis of SARS-CoV-2 by FDA under an Emergency Use Authorization (EUA).  This EUA will remain in effect (meaning this tes t can be used) for the duration of  the COVID-19 declaration under Section 564(b)(1) of the Act, 21 U.S.C. section 360-bbb-3(b)(1), unless the authorization is terminated or revoked sooner.  Performed at [facility], [address] [place], [place] [address]   Blood Culture (routine x 2)     Status: None   Collection Time: 12/20/19  6:01 PM   Specimen: BLOOD  Result Value Ref Range Status   Specimen Description BLOOD RH  Final   Special Requests   Final    BOTTLES DRAWN AEROBIC AND ANAEROBIC Blood Culture adequate volume   Culture   Final     NO GROWTH 5 DAYS Performed at [facility], [address]  [place], [place] [address]    Report Status 10/27/2019 FINAL  Final  MRSA PCR Screening     Status: None   Collection Time: 10/23/19  7:43 AM   Specimen: Nasal Mucosa; Nasopharyngeal  Result Value Ref Range Status   MRSA by PCR NEGATIVE NEGATIVE Final    Comment:        The GeneXpert MRSA Assay (FDA approved for NASAL specimens only), is one component of a comprehensive MRSA colonization surveillance program. It is not intended to diagnose MRSA infection nor to guide or monitor treatment for MRSA infections. Performed at [facility], [address] [place], [place] [address]      Coagulation Studies: Recent Labs    10/29/19 0600 10/30/19 0358 10/31/19 0402  LABPROT 31.7* 32.0* 29.4*  INR 3.2* 3.2* 2.9*    Urinalysis: No results for input(s): COLORURINE, LABSPEC, PHURINE, GLUCOSEU, HGBUR, BILIRUBINUR, KETONESUR, PROTEINUR, UROBILINOGEN, NITRITE, LEUKOCYTESUR in the last 72 hours.  Invalid input(s): APPERANCEUR      Imaging: MR BRAIN WO CONTRAST  Result Date: 10/30/2019 CLINICAL DATA:  Cerebral hemorrhage suspected. EXAM: MRI HEAD WITHOUT CONTRAST TECHNIQUE: Multiplanar, multiecho pulse sequences of the brain and surrounding structures were obtained without intravenous contrast. COMPARISON:  None. FINDINGS: Brain: Acute lacunar infarct at the right globus pallidus. Small and more subacute appearing infarct in the bilateral cerebellum. No pre-existing ischemic injury. Partially empty sella with sellar expansion, nonspecific in isolation. No hemorrhage, hydrocephalus, or masslike finding. Vascular: Slow or absent flow at the distal left V4 segment based on flow voids. Skull and upper cervical spine: Normal marrow signal Sinuses/Orbits: Nasopharyngeal and nasal cavity fluid with bilateral mastoid and middle ear opacification in the setting of intubation. IMPRESSION: 1. Acute lacunar infarct at the  right globus pallidus. 2. 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 3. No pre-existing ischemic injury. 4. Slow or absent flow at the distal left V4 segment. 5. Nasopharyngeal and bilateral mastoid fluid in the setting of intubation. Electronically Signed   By: Marnee Spring M.D.   On: 10/30/2019 13:04   DG Chest Port 1 View  Result Date: 10/31/2019 CLINICAL DATA:  Hypoxia EXAM: PORTABLE CHEST 1 VIEW COMPARISON:  October 27, 2019 FINDINGS: Endotracheal tube tip is 4.4 cm above the carina. Feeding tube tip is in the stomach. Central catheter tip in the left innominate vein near the junction with the superior vena cava. There is a minimal right apical pneumothorax without appreciable tension component. There is bibasilar atelectasis. Lungs elsewhere are clear. Heart size and pulmonary vascularity are normal. No adenopathy. No pneumothorax. IMPRESSION: Rather minimal right apical pneumothorax without tension component. Tube and catheter positions as described. Atelectatic change in the bases. Stable cardiac silhouette. These results will be called to the ordering clinician or representative by the Radiologist Assistant, and communication documented in the PACS or [employer]. Electronically Signed   By: Bretta Bang III M.D.   On: 10/31/2019 07:59     Assessment & Plan: Pt is a 59 y.o. African-American  male with Medical problems of diabetes hypertension and obesity , was admitted on 03-Nov-2019 with Acute respiratory failure with hypoxia (HCC) [J96.01] Acute respiratory failure due to COVID-19 (HCC) [U07.1, J96.00] Pneumonia due to COVID-19 virus [U07.1, J12.82] , was on non invasive ventilation initially, but required mechanical ventilation on 10/26/19 due to severe ARDS. Currently on FIO2 60%.   # Acute renal failure Patient sustained multiorgan failure, including renal failure.  AKI likely secondary to ATN CRRT started 10/27/2019 -Maintain the patient on  CRRT at  this time.  Serum electrolytes acceptable.  Consider increasing ultrafiltration tomorrow.  #Acute respiratory failure, secondary to COVID-19 pneumonia Maintain current ventilatory support.  #Hyperkalemia Serum potassium now normalized at 4.3.  Continue to monitor.  #Severe hyperphosphatemia Phosphorus now down to 3.8.  Continue to monitor.  #Shock liver INR elevated, 2.9 at last check.  Patient is Erroll Luna witness With multiple severe comorbidities, overall prognosis appears poor   LOS: 9 Danniel Tones 8/23/20218:31 AM    Note: This note was prepared with Dragon dictation. Any transcription errors are unintentional

## 2019-10-31 NOTE — Progress Notes (Signed)
Assisted wife with tele-visit via elink 

## 2019-10-31 NOTE — Progress Notes (Signed)
Nutrition Follow-up ° °DOCUMENTATION CODES:  ° °Morbid obesity ° °INTERVENTION:  °Continue Pivot 1.5 Cal at 60 mL/hr (1440 mL goal daily volume) + PROSource TF 90 mL BID per tube. Provides 2320 kcal, 179 grams of protein, 1080 mL H2O daily. With current propofol rate provides 2890 kcal daily. ° °Provide B-complex with C QHS per tube. ° °NUTRITION DIAGNOSIS:  ° °Inadequate oral intake related to inability to eat as evidenced by NPO status. ° °Ongoing. ° °GOAL:  ° °Patient will meet greater than or equal to 90% of their needs ° °Met with TF regimen. ° °MONITOR:  ° °Vent status, Labs, Weight trends, TF tolerance, I & O's ° °REASON FOR ASSESSMENT:  ° °Ventilator °  ° °ASSESSMENT:  ° °58 year old male with PMHx of DM, HLD, HTN, thyroid nodule, gout, pulmonary embolism admitted with COVID-19 PNA. ° °8/18 intubated  °8/19 CRRT started °8/20 Dobbhoff placed °8/22 tube feeds initiated °8/22 MRI brain: acute lacunar infarct at right globus pallidus; small bilateral cerebellar infarcts with subacute appearance ° °Patient is currently intubated on ventilator support °MV: 15.1 L/min °Temp (24hrs), Avg:98.9 °F (37.2 °C), Min:97.7 °F (36.5 °C), Max:99.7 °F (37.6 °C) ° °Propofol: 21.6 ml/hr (570 kcal) ° °Medications reviewed and include: vitamin C 500 mg daily, Colace 100 mg BID, Solu-Cortef 50 mg Q6hrs IV, Novolog 0-20 units Q4hrs, Novolog 5 units Q4hrs, Levemir 33 units Q12hrs, MVI daily, Miralax, thaimine 100 mg daily, zinc sulfate 220 mg daily, propofol gtt. ° °Labs reviewed: CBG 332-334, BUN 75, Creatinine 4.2, Magnesium 2.8. ° °I/O: 250 mL UOP yesterday; 1835 mL removed from CRRT yesterday ° °Enteral Access: Dobbhoff tube placed 8/20; tip in stomach per chest x-ray 8/20 ° °TF regimen: Pivot 1.5 Cal at 60 mL/hr + PROSource 90 mL BID ° °Weight trend: 143.7 kg on 8/23; -3.6 kg from 8/17 ° °Discussed with RN and on rounds.  ° °Diet Order:   °Diet Order   °       °  Diet NPO time specified  Diet effective now       °  °  °  °   ° °EDUCATION NEEDS:  ° °No education needs have been identified at this time ° °Skin:  Skin Assessment: Reviewed RN Assessment ° °Last BM:  10/30/2019 per chart ° °Height:  ° °Ht Readings from Last 1 Encounters:  °10/25/19 6' 3" (1.905 m)  ° °Weight:  ° °Wt Readings from Last 1 Encounters:  °10/31/19 (!) 143.7 kg  ° °Ideal Body Weight:  89.1 kg ° °BMI:  Body mass index is 39.6 kg/m². ° °Estimated Nutritional Needs:  ° °Kcal:  2918 ° °Protein:  180 grams ° °Fluid:  >/= 2.2 L/day ° ° King, MS, RD, LDN °Pager number available on Amion °

## 2019-10-31 NOTE — Progress Notes (Signed)
Pt remains unresponsive overnight, but tachypneic intermittently with RR 30's-50's, HR 125-140's & SpO2 dropping intermittently to 87%. Multiple PRN's attempted, propofol gtt started, FiO2 titrated btwn 40-70% to maintain Sp02 > 88% per MD order. WBC increased- procalcitonin, BC & resp. Culture sent.  Will send UA/UC once pt voids.  CRRT continued O/N without complication, TF maintained @ 60 mL/h.  With new TF order pt requiring 15 units of SSI Q4 for CBG's > 300.  Pt might benefit from increased long-acting coverage.  Wife updated around 3 am.

## 2019-10-31 NOTE — Progress Notes (Signed)
Assisted pastor with tele-visit via elink

## 2019-11-01 ENCOUNTER — Inpatient Hospital Stay: Payer: 59

## 2019-11-01 ENCOUNTER — Inpatient Hospital Stay (HOSPITAL_COMMUNITY)
Admit: 2019-11-01 | Discharge: 2019-11-01 | Disposition: A | Discharge status: Home / Self Care | Payer: 59 | Attending: Internal Medicine | Admitting: Internal Medicine

## 2019-11-01 DIAGNOSIS — R0689 Other abnormalities of breathing: Secondary | ICD-10-CM

## 2019-11-01 LAB — BASIC METABOLIC PANEL
Anion gap: 10 (ref 5–15)
BUN: 69 mg/dL — ABNORMAL HIGH (ref 6–20)
CO2: 24 mmol/L (ref 22–32)
Calcium: 7.3 mg/dL — ABNORMAL LOW (ref 8.9–10.3)
Chloride: 98 mmol/L (ref 98–111)
Creatinine, Ser: 3.62 mg/dL — ABNORMAL HIGH (ref 0.61–1.24)
GFR calc Af Amer: 20 mL/min — ABNORMAL LOW (ref >60)
GFR calc non Af Amer: 18 mL/min — ABNORMAL LOW (ref >60)
Glucose, Bld: 430 mg/dL — ABNORMAL HIGH (ref 70–99)
Potassium: 5.9 mmol/L — ABNORMAL HIGH (ref 3.5–5.1)
Sodium: 132 mmol/L — ABNORMAL LOW (ref 135–145)

## 2019-11-01 LAB — ECHOCARDIOGRAM COMPLETE
AR max vel: 2.21 cm2
AV Area VTI: 2.46 cm2
AV Area mean vel: 2.62 cm2
AV Mean grad: 5 mmHg
AV Peak grad: 10 mmHg
Ao pk vel: 1.58 m/s
Area-P 1/2: 6.07 cm2
Height: 75 in
S' Lateral: 2.72 cm
Weight: 5001.8 oz

## 2019-11-01 LAB — CBC WITH DIFFERENTIAL/PLATELET
Abs Immature Granulocytes: 2.4 10*3/uL — ABNORMAL HIGH (ref 0.00–0.07)
Basophils Absolute: 0 10*3/uL (ref 0.0–0.1)
Basophils Relative: 0 %
Eosinophils Absolute: 0 10*3/uL (ref 0.0–0.5)
Eosinophils Relative: 0 %
HCT: 24.7 % — ABNORMAL LOW (ref 39.0–52.0)
Hemoglobin: 8.9 g/dL — ABNORMAL LOW (ref 13.0–17.0)
Lymphocytes Relative: 4 %
Lymphs Abs: 1.4 10*3/uL (ref 0.7–4.0)
MCH: 28.1 pg (ref 26.0–34.0)
MCHC: 36 g/dL (ref 30.0–36.0)
MCV: 77.9 fL — ABNORMAL LOW (ref 80.0–100.0)
Metamyelocytes Relative: 4 %
Monocytes Absolute: 1.4 10*3/uL — ABNORMAL HIGH (ref 0.1–1.0)
Monocytes Relative: 4 %
Myelocytes: 3 %
Neutro Abs: 29.7 10*3/uL — ABNORMAL HIGH (ref 1.7–7.7)
Neutrophils Relative %: 85 %
Platelets: 250 10*3/uL (ref 150–400)
RBC: 3.17 MIL/uL — ABNORMAL LOW (ref 4.22–5.81)
RDW: 15.4 % (ref 11.5–15.5)
Smear Review: NORMAL
WBC: 34.9 10*3/uL — ABNORMAL HIGH (ref 4.0–10.5)
nRBC: 0.3 % — ABNORMAL HIGH (ref 0.0–0.2)

## 2019-11-01 LAB — RENAL FUNCTION PANEL
Albumin: 2.2 g/dL — ABNORMAL LOW (ref 3.5–5.0)
Anion gap: 10 (ref 5–15)
BUN: 68 mg/dL — ABNORMAL HIGH (ref 6–20)
CO2: 24 mmol/L (ref 22–32)
Calcium: 7.8 mg/dL — ABNORMAL LOW (ref 8.9–10.3)
Chloride: 103 mmol/L (ref 98–111)
Creatinine, Ser: 3.66 mg/dL — ABNORMAL HIGH (ref 0.61–1.24)
GFR calc Af Amer: 20 mL/min — ABNORMAL LOW (ref >60)
GFR calc non Af Amer: 17 mL/min — ABNORMAL LOW (ref >60)
Glucose, Bld: 135 mg/dL — ABNORMAL HIGH (ref 70–99)
Phosphorus: 4.6 mg/dL (ref 2.5–4.6)
Potassium: 5.1 mmol/L (ref 3.5–5.1)
Sodium: 137 mmol/L (ref 135–145)

## 2019-11-01 LAB — GLUCOSE, CAPILLARY
Glucose-Capillary: 439 mg/dL — ABNORMAL HIGH (ref 70–99)
Glucose-Capillary: 438 mg/dL — ABNORMAL HIGH (ref 70–99)
Glucose-Capillary: 407 mg/dL — ABNORMAL HIGH (ref 70–99)
Glucose-Capillary: 425 mg/dL — ABNORMAL HIGH (ref 70–99)
Glucose-Capillary: 353 mg/dL — ABNORMAL HIGH (ref 70–99)
Glucose-Capillary: 328 mg/dL — ABNORMAL HIGH (ref 70–99)
Glucose-Capillary: 310 mg/dL — ABNORMAL HIGH (ref 70–99)
Glucose-Capillary: 288 mg/dL — ABNORMAL HIGH (ref 70–99)
Glucose-Capillary: 255 mg/dL — ABNORMAL HIGH (ref 70–99)
Glucose-Capillary: 243 mg/dL — ABNORMAL HIGH (ref 70–99)
Glucose-Capillary: 201 mg/dL — ABNORMAL HIGH (ref 70–99)
Glucose-Capillary: 186 mg/dL — ABNORMAL HIGH (ref 70–99)
Glucose-Capillary: 149 mg/dL — ABNORMAL HIGH (ref 70–99)
Glucose-Capillary: 128 mg/dL — ABNORMAL HIGH (ref 70–99)
Glucose-Capillary: 120 mg/dL — ABNORMAL HIGH (ref 70–99)
Glucose-Capillary: 123 mg/dL — ABNORMAL HIGH (ref 70–99)
Glucose-Capillary: 136 mg/dL — ABNORMAL HIGH (ref 70–99)
Glucose-Capillary: 169 mg/dL — ABNORMAL HIGH (ref 70–99)
Glucose-Capillary: 182 mg/dL — ABNORMAL HIGH (ref 70–99)
Glucose-Capillary: 179 mg/dL — ABNORMAL HIGH (ref 70–99)

## 2019-11-01 LAB — PROTIME-INR
INR: 2.8 — ABNORMAL HIGH (ref 0.8–1.2)
Prothrombin Time: 28.2 seconds — ABNORMAL HIGH (ref 11.4–15.2)

## 2019-11-01 LAB — PROCALCITONIN: Procalcitonin: 4.84 ng/mL

## 2019-11-01 LAB — POTASSIUM: Potassium: 4.4 mmol/L (ref 3.5–5.1)

## 2019-11-01 LAB — TRIGLYCERIDES: Triglycerides: 811 mg/dL — ABNORMAL HIGH (ref <150)

## 2019-11-01 MED ORDER — VITAMIN K1 10 MG/ML IJ SOLN
5.0000 mg | Freq: Once | INTRAVENOUS | Status: AC
  Administered 2019-11-01: 5 mg via INTRAVENOUS
  Filled 2019-11-01: qty 0.5

## 2019-11-01 MED ORDER — PRISMASOL BGK 0/2.5 32-2.5 MEQ/L IV SOLN
INTRAVENOUS | Status: DC
  Filled 2019-11-01: qty 5000

## 2019-11-01 MED ORDER — INSULIN REGULAR(HUMAN) IN NACL 100-0.9 UT/100ML-% IV SOLN
INTRAVENOUS | Status: DC
  Administered 2019-11-01: 14 [IU]/h via INTRAVENOUS
  Administered 2019-11-01: 24 [IU]/h via INTRAVENOUS
  Administered 2019-11-02: 8.5 [IU]/h via INTRAVENOUS
  Filled 2019-11-01 (×3): qty 100

## 2019-11-01 MED ORDER — DEXTROSE IN LACTATED RINGERS 5 % IV SOLN
INTRAVENOUS | Status: DC
  Administered 2019-11-02: 125 mL/h via INTRAVENOUS

## 2019-11-01 MED ORDER — COLCHICINE 0.6 MG PO TABS
0.3000 mg | ORAL_TABLET | Freq: Every day | ORAL | Status: DC
  Administered 2019-11-02: 0.3 mg
  Filled 2019-11-01: qty 0.5

## 2019-11-01 MED ORDER — INSULIN DETEMIR 100 UNIT/ML ~~LOC~~ SOLN
24.0000 [IU] | Freq: Two times a day (BID) | SUBCUTANEOUS | Status: DC
  Administered 2019-11-01: 24 [IU] via SUBCUTANEOUS
  Filled 2019-11-01 (×2): qty 0.24

## 2019-11-01 MED ORDER — FENTANYL 2500MCG IN NS 250ML (10MCG/ML) PREMIX INFUSION
0.0000 ug/h | INTRAVENOUS | Status: DC
  Administered 2019-11-01 – 2019-11-02 (×4): 250 ug/h via INTRAVENOUS
  Filled 2019-11-01 (×4): qty 250

## 2019-11-01 MED ORDER — HEPARIN SODIUM (PORCINE) 1000 UNIT/ML DIALYSIS
1000.0000 [IU] | INTRAMUSCULAR | Status: DC | PRN
  Filled 2019-11-01: qty 6
  Filled 2019-11-01: qty 4

## 2019-11-01 MED ORDER — POLYETHYLENE GLYCOL 3350 17 G PO PACK: 17.0000 g | PACK | Freq: Every day | ORAL | Status: DC

## 2019-11-01 MED ORDER — INSULIN ASPART 100 UNIT/ML ~~LOC~~ SOLN
3.0000 [IU] | SUBCUTANEOUS | Status: DC
  Administered 2019-11-01: 3 [IU] via SUBCUTANEOUS
  Administered 2019-11-02 (×2): 6 [IU] via SUBCUTANEOUS
  Filled 2019-11-01 (×3): qty 1

## 2019-11-01 MED ORDER — ASCORBIC ACID 500 MG PO TABS
500.0000 mg | ORAL_TABLET | Freq: Every day | ORAL | Status: DC
  Administered 2019-11-02: 500 mg
  Filled 2019-11-01: qty 1

## 2019-11-01 MED ORDER — ADULT MULTIVITAMIN W/MINERALS CH
1.0000 | ORAL_TABLET | Freq: Every day | ORAL | Status: DC
  Administered 2019-11-02: 1
  Filled 2019-11-01: qty 1

## 2019-11-01 MED ORDER — DEXTROSE 50 % IV SOLN: 0.0000 mL | INTRAVENOUS | Status: DC | PRN

## 2019-11-01 MED ORDER — INSULIN REGULAR(HUMAN) IN NACL 100-0.9 UT/100ML-% IV SOLN
INTRAVENOUS | Status: DC
  Administered 2019-11-01: 22 [IU]/h via INTRAVENOUS
  Administered 2019-11-01: 14 [IU]/h via INTRAVENOUS
  Filled 2019-11-01 (×2): qty 100

## 2019-11-01 MED ORDER — SODIUM CHLORIDE 0.9 % IV SOLN
INTRAVENOUS | Status: DC | PRN
  Filled 2019-11-01 (×4): qty 1000

## 2019-11-01 MED ORDER — DOCUSATE SODIUM 50 MG/5ML PO LIQD
100.0000 mg | Freq: Two times a day (BID) | ORAL | Status: DC
  Administered 2019-11-01: 100 mg
  Filled 2019-11-01: qty 10

## 2019-11-01 MED ORDER — DEXMEDETOMIDINE HCL IN NACL 400 MCG/100ML IV SOLN
0.4000 ug/kg/h | INTRAVENOUS | Status: DC
  Administered 2019-11-02: 0.6 ug/kg/h via INTRAVENOUS
  Filled 2019-11-01: qty 100

## 2019-11-01 MED ORDER — HEPARIN (PORCINE) 2000 UNITS/L FOR CRRT
INTRAVENOUS_CENTRAL | Status: DC | PRN
  Filled 2019-11-01: qty 1000

## 2019-11-01 MED ORDER — ZINC SULFATE 220 (50 ZN) MG PO CAPS
220.0000 mg | ORAL_CAPSULE | Freq: Every day | ORAL | Status: DC
  Administered 2019-11-02: 220 mg
  Filled 2019-11-01: qty 1

## 2019-11-01 MED ORDER — CLONAZEPAM 0.5 MG PO TBDP
0.5000 mg | ORAL_TABLET | Freq: Two times a day (BID) | ORAL | Status: DC
  Administered 2019-11-01 – 2019-11-02 (×3): 0.5 mg
  Filled 2019-11-01 (×4): qty 1

## 2019-11-01 MED ORDER — VASOPRESSIN 20 UNITS/100 ML INFUSION FOR SHOCK
0.0000 [IU]/min | INTRAVENOUS | Status: DC
  Administered 2019-11-01 (×2): 0.03 [IU]/min via INTRAVENOUS
  Administered 2019-11-02: 0.04 [IU]/min via INTRAVENOUS
  Filled 2019-11-01: qty 100

## 2019-11-01 MED FILL — Phenylephrine HCl IV Soln 10 MG/ML: INTRAVENOUS | Qty: 10 | Status: AC

## 2019-11-01 MED FILL — Sodium Chloride IV Soln 0.9%: INTRAVENOUS | Qty: 250 | Status: AC

## 2019-11-01 MED FILL — Vasopressin IV Soln 20 Unit/ML (For IV Infusion): INTRAVENOUS | Qty: 1 | Status: AC

## 2019-11-01 MED FILL — Sodium Chloride IV Soln 0.9%: INTRAVENOUS | Qty: 100 | Status: AC

## 2019-11-01 NOTE — Progress Notes (Signed)
Assisted tele visit to patient with family member.  Jhada Risk McEachran, RN  

## 2019-11-01 NOTE — Progress Notes (Signed)
CRITICAL CARE NOTE 59 year old male with PMH significant for uncontrolled type 2 DM, HLD, HTN, Thyroid nodule, gout, morbid obesity and pulmonary embolism presented to [facility] ER on 08/14 via EMS from home with generalized malaise, shortness of breath and chest discomfort with inspiration. Admitted with acute hypoxic respiratory failure secondary to COVID-19 Pneumonia. On high flow O2 alternating with BiPAP  SIGNIFICANT EVENTS 08/14: Pt admitted tostepdown unit with COVID pneumonia 08/16:Remains housed in EDP due to no rooms. 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  8/23 remains on vent, on CRRT, multiorgan failure, CXR small RT sided Apical PTX  STUDIES: 8/14:Chest XrayshowsInterval development of patchy bilateral airspace opacities which may represent pneumonia 8/14: CTA Chest Slightly suboptimal opacification of the main pulmonary artery, however no central or proximal segmental pulmonary embolism.Extensive multifocal airspace opacities, consistent with atypical viral pneumonia  CULTURES: 8/14:BCxpending  ANTIBIOTICS: Ceftriaxone 8/14>8/23 Azithromycin8/14>8/23   CC  follow up respiratory failure  SUBJECTIVE Patient remains critically ill Prognosis is guarded Multiorgan failure CXR does not show PTX today On CRRT   BP (!) 95/58   Pulse (!) 126   Temp 99.9 F (37.7 C)   Resp (!) 30   Ht 6\' 3"  (1.905 m)   Wt (!) 141.8 kg   SpO2 92%   BMI 39.07 kg/m    I/O last 3 completed shifts: In: 3697.1 [I.V.:1082.1; Other:30; NG/GT:2585] Out: 3664 [Urine:185; Other:3479] No intake/output data recorded.  SpO2: 92 % O2  Flow Rate (L/min): 50 L/min FiO2 (%): 50 %  Estimated body mass index is 39.07 kg/m as calculated from the following:   Height as of this encounter: 6\' 3"  (1.905 m).   Weight as of this encounter: 141.8 kg.  SIGNIFICANT EVENTS   REVIEW OF SYSTEMS  PATIENT IS UNABLE TO PROVIDE COMPLETE REVIEW OF SYSTEMS DUE TO SEVERE CRITICAL ILLNESS      COVID-19 DISASTER DECLARATION:   FULL CONTACT PHYSICAL EXAMINATION WAS NOT POSSIBLE DUE TO TREATMENT OF COVID-19  AND CONSERVATION OF PERSONAL PROTECTIVE EQUIPMENT, LIMITED EXAM FINDINGS INCLUDE-   PHYSICAL EXAMINATION:  GENERAL:critically ill appearing, +resp distress NEUROLOGIC: obtunded, GCS<8   Patient assessed or the symptoms described in the history of present illness.  In the context of the Global COVID-19 pandemic, which necessitated consideration that the patient might be at risk for infection with the SARS-CoV-2 virus that causes COVID-19, Institutional protocols and algorithms that pertain to the evaluation of patients at risk for COVID-19 are in a state of rapid change based on information released by regulatory bodies including the CDC and federal and state organizations. These policies and algorithms were followed during the patient's care while in hospital.    MEDICATIONS: I have reviewed all medications and confirmed regimen as documented   CULTURE RESULTS   Recent Results (from the past 240 hour(s))  Blood Culture (routine x 2)     Status: None   Collection Time: 2019-10-26  4:55 PM   Specimen: BLOOD  Result Value Ref Range Status   Specimen Description BLOOD BLOOD LEFT FOREARM  Final   Special Requests   Final    BOTTLES DRAWN AEROBIC AND ANAEROBIC Blood Culture results may not be optimal due to an excessive volume of blood received in  culture bottles   Culture   Final    NO GROWTH 5 DAYS Performed at [facility], [address] [place], [place] [address]    Report Status 10/27/2019 FINAL  Final  SARS  Coronavirus 2 by RT PCR (hospital order, performed in [facility] hospital lab) Nasopharyngeal Nasopharyngeal Swab     Status: Abnormal   Collection Time: 10/25/2019  5:03 PM   Specimen: Nasopharyngeal Swab  Result Value Ref Range Status   SARS Coronavirus 2 POSITIVE (A) NEGATIVE Final    Comment: RESULT CALLED TO, READ BACK BY AND VERIFIED WITH: APRIL BRUMGARD 10/16/2019 AT 2027 HS (NOTE) SARS-CoV-2 target nucleic acids are DETECTED  SARS-CoV-2 RNA is generally detectable in upper respiratory specimens  during the acute phase of infection.  Positive results are indicative  of the presence of the identified virus, but do not rule out bacterial infection or co-infection with other pathogens not detected by the test.  Clinical correlation with patient history and  other diagnostic information is necessary to determine patient infection status.  The expected result is negative.  Fact Sheet for Patients:   BoilerBrush.com.cy   Fact Sheet for Healthcare Providers:   https://pope.com/    This test is not yet approved or cleared by the [place] FDA and  has been authorized for detection and/or diagnosis of SARS-CoV-2 by FDA under an Emergency Use Authorization (EUA).  This EUA will remain in effect (meaning this tes t can be used) for the duration of  the COVID-19 declaration under Section 564(b)(1) of the Act, 21 U.S.C. section 360-bbb-3(b)(1), unless the authorization is terminated or revoked sooner.  Performed at [facility], [address] [place], [place] [address]   Blood Culture (routine x 2)     Status: None   Collection Time: 10/30/2019  6:01 PM   Specimen: BLOOD  Result Value Ref Range Status   Specimen Description BLOOD RH  Final   Special Requests   Final    BOTTLES DRAWN AEROBIC AND ANAEROBIC Blood Culture adequate volume   Culture   Final    NO GROWTH 5 DAYS Performed at [facility], [address] [place], [place] [address]    Report Status 10/27/2019 FINAL  Final  MRSA PCR Screening     Status: None   Collection Time: 10/23/19  7:43 AM   Specimen: Nasal Mucosa; Nasopharyngeal  Result Value Ref Range Status   MRSA by PCR NEGATIVE NEGATIVE Final    Comment:        The GeneXpert MRSA Assay (FDA approved for NASAL specimens only), is one component of a comprehensive MRSA colonization surveillance program. It is not intended to diagnose MRSA infection nor to guide or monitor treatment for MRSA infections. Performed at [facility], [address], [place], [place] [address]           IMAGING    No results found.   Nutrition Status: Nutrition Problem: Inadequate oral intake Etiology: inability to eat Signs/Symptoms: NPO status Interventions: Refer to RD note for recommendations     Indwelling Urinary Catheter continued, requirement due to   Reason to continue Indwelling Urinary Catheter strict Intake/Output monitoring for hemodynamic instability   Central Line/ continued, requirement due to  Reason to continue [employer] Monitoring of central venous pressure or other hemodynamic parameters and poor IV access   Ventilator continued, requirement due to severe respiratory failure   Ventilator Sedation RASS 0 to -2      ASSESSMENT AND  PLAN SYNOPSIS  Acute hypoxemic respiratory failure due to COVID-19 pneumonia / ARDS Mechanical ventilation via ARDS protocol, target PRVC 6 cc/kg Wean PEEP and FiO2 as able Goal plateau pressure less than 30, driving pressure less than 15 Paralytics if necessary for vent synchrony, gas exchange Cycle prone positioning if necessary for oxygenation Deep sedation per PAD protocol, goal RASS -4, currently fentanyl, midazolam Diuresis as blood pressure and renal function can tolerate, goal CVP 5-8.   diuresis as tolerated based on Kidney function VAP prevention order set Remdesivir +BARIC IV STEROIDS  Follow  inflammatory markers: Ferritin, D-dimer, CRP, IL-6, LDH Vitamin C, zinc Plan to repeat and check resp cultures    Severe ACUTE Hypoxic and Hypercapnic Respiratory Failure -continue Full MV support -continue Bronchodilator Therapy -Wean Fio2 and PEEP as tolerated -VAP/VENT bundle implementation  ACUTE DIASTOLIC CARDIAC FAILURE- CHECK ECHO   Morbid obesity, possible OSA.   Will certainly impact respiratory mechanics, ventilator weaning Suspect will need to consider additional PEEP  ACUTE KIDNEY INJURY/Renal Failure -continue Foley Catheter-assess need -Avoid nephrotoxic agents -Follow urine output, BMP -Ensure adequate renal perfusion, optimize oxygenation -Renal dose medications On CRRT, follow up Nephrology Consultation     NEUROLOGY Acute toxic metabolic encephalopathy, need for sedation Goal RASS -2 to -3 MRI c/w acute CVA's  SHOCK-SEPSIS/HYPOVOLUMIC/CARDIOGENIC -use vasopressors to keep MAP>65  CARDIAC ICU monitoring  ID -continue IV abx as prescibed -follow up cultures  GI GI PROPHYLAXIS as indicated   DIET-->TF's as tolerated Constipation protocol as indicated  ENDO - will use ICU hypoglycemic\Hyperglycemia protocol if indicated     ELECTROLYTES -follow labs as needed -replace as needed -pharmacy consultation and following   DVT/GI PRX ordered and assessed TRANSFUSIONS AS NEEDED MONITOR FSBS I Assessed the need for Labs I Assessed the need for Foley I Assessed the need for Central Venous Line Family Discussion when available I Assessed the need for Mobilization I made an Assessment of medications to be adjusted accordingly Safety Risk assessment completed   CASE DISCUSSED IN MULTIDISCIPLINARY ROUNDS WITH ICU TEAM  Critical Care Time devoted to patient care services described in this note is 35 minutes.   Overall, patient is critically ill, prognosis is guarded.  Patient with Multiorgan failure and at high risk for cardiac arrest and  death.   Prognosis is very poor,family'/wife has been updated daily  Lucie Leather, M.D.  [facility]  Medical Director [facility] Medical Director [facility] Cardio-Pulmonary Department

## 2019-11-01 NOTE — Progress Notes (Signed)
Assisted tele visit to patient with family member.  Delila Kuklinski Anderson, RN   

## 2019-11-01 NOTE — Progress Notes (Signed)
1425: CRRT filter clotted, blood returned. Dr. Cherylann Ratel messaged to see if he would like oxiris filter to be used instead of M150. Awaiting response.

## 2019-11-01 NOTE — Progress Notes (Signed)
*  PRELIMINARY RESULTS* Echocardiogram 2D Echocardiogram has been performed.  Spencer Woodward 11/01/2019, 2:08 PM

## 2019-11-01 NOTE — Progress Notes (Signed)
[address] [place], [place] [address] Phone [phone]. Fax [phone]  Date: 11/01/2019                  Patient Name:  Spencer Woodward  MRN: [id number]  DOB: 05/24/1960  Age / Sex: 59 y.o., male         PCP: Revelo, Presley RaddleAdrian Mancheno, MD                 Service Requesting Consult: IM/ Erin FullingKasa, Kurian, MD                 Reason for Consult: ARF            History of Present Illness: Patient is a 59 y.o. male with medical problems of DM,HTN , Obesity, who was [facility] for evaluation of SOB, generalized malaise for 2 days prior to the presentation, got diagnosed with COVID pneumonia.Initially patient was placed on BIPAP, but due to severe ARDS, required intubation and mechanical ventilation since 10/26/19. Patient deteriorated with multiorgan failure including renal failure, started on CRRT on 10/27/2019.  Update:  Patient remains critically ill at this time.  He is maintained on the ventilator as well as pressors and on CRRT.  Urine output was only 185 cc over the preceding 24 hours.  Potassium noted to be high at 5.9 and discussed with nursing.  We have switched him to a 2K bath on CRRT.   Vital Signs: Blood pressure (!) 95/58, pulse (!) 126, temperature 99.9 F (37.7 C), resp. rate (!) 30, height 6\' 3"  (1.905 m), weight (!) 141.8 kg, SpO2 92 %.   Intake/Output Summary (Last 24 hours) at 11/01/2019 0827 Last data filed at 11/01/2019 0800 Gross per 24 hour  Intake 2700.26 ml  Output 2840 ml  Net -139.74 ml    Weight trends: Filed Weights   10/30/19 0500 10/31/19 0500 11/01/19 0325  Weight: (!) 143.9 kg (!) 143.7 kg (!) 141.8 kg    Physical Exam: General:  Critically ill appearing, intubated and sedated  HEENT  ETT in place, NGT, conjunctival edema  Lungs:  vent dependent  Heart::  Regular, tachycardic  Abdomen:  Nondistended  Extremities:  Trace edema  Neurologic:  Sedated   Skin:  no rashes or lesions noted  Access:  Left IJ temp cath   Foley:  Clots noted in tubing       Lab results: Basic Metabolic Panel: Recent Labs  Lab 10/30/19 0358 10/30/19 0358 10/30/19 1647 10/30/19 1647 10/31/19 0402 10/31/19 0402 10/31/19 1541 10/31/19 2219 11/01/19 0336  NA 139   < > 138   < > 135  --  135  --  132*  K 3.9   < > 3.7   < > 4.3   < > 5.4* 5.4* 5.9*  CL 102   < > 102   < > 101  --  101  --  98  CO2 22   < > 25   < > 22  --  24  --  24  GLUCOSE 212*   < > 218*   < > 324*  --  370*  --  430*  BUN 84*   < > 82*   < > 75*  --  65*  --  69*  CREATININE 6.17*   < > 4.90*   < > 4.20*  --  3.63*  --  3.62*  CALCIUM 8.2*   < > 8.1*   < > 7.8*  --  7.1*  --  7.3*  MG 2.8*  --  2.6*  --  2.8*  --   --   --   --   PHOS 4.6  --  3.8  --   --   --  5.2*  --   --    < > = values in this interval not displayed.    Liver Function Tests: Recent Labs  Lab 10/28/19 0341 10/28/19 1617 10/31/19 1541  AST 96*  --   --   ALT 109*  --   --   ALKPHOS 95  --   --   BILITOT 1.6*  --   --   PROT 5.9*  --   --   ALBUMIN 2.2*   < > 2.2*   < > = values in this interval not displayed.   No results for input(s): LIPASE, AMYLASE in the last 168 hours. No results for input(s): AMMONIA in the last 168 hours.  CBC: Recent Labs  Lab 10/31/19 0402 11/01/19 0336  WBC 30.8* 34.9*  NEUTROABS 25.3* 29.7*  HGB 8.9* 8.9*  HCT 24.6* 24.7*  MCV 74.5* 77.9*  PLT 193 250    Cardiac Enzymes: Recent Labs  Lab 10/27/19 1630  CKTOTAL 1,935*    BNP: Invalid input(s): POCBNP  CBG: Recent Labs  Lab 11/01/19 0404 11/01/19 0502 11/01/19 0603 11/01/19 0719 11/01/19 0801  GLUCAP 438* 407* 425* 353* 328*    Microbiology: Recent Results (from the past 720 hour(s))  Blood Culture (routine x 2)     Status: None   Collection Time: 10/28/2019  4:55 PM   Specimen: BLOOD  Result Value Ref Range Status   Specimen Description BLOOD BLOOD LEFT FOREARM  Final   Special Requests   Final    BOTTLES DRAWN AEROBIC AND ANAEROBIC Blood Culture  results may not be optimal due to an excessive volume of blood received in culture bottles   Culture   Final    NO GROWTH 5 DAYS Performed at [facility], [address] [place], [place] [address]    Report Status 10/27/2019 FINAL  Final  SARS Coronavirus 2 by RT PCR (hospital order, performed in [facility] hospital lab) Nasopharyngeal Nasopharyngeal Swab     Status: Abnormal   Collection Time: 10/16/2019  5:03 PM   Specimen: Nasopharyngeal Swab  Result Value Ref Range Status   SARS Coronavirus 2 POSITIVE (A) NEGATIVE Final    Comment: RESULT CALLED TO, READ BACK BY AND VERIFIED WITH: APRIL BRUMGARD 10/10/2019 AT 2027 HS (NOTE) SARS-CoV-2 target nucleic acids are DETECTED  SARS-CoV-2 RNA is generally detectable in upper respiratory specimens  during the acute phase of infection.  Positive results are indicative  of the presence of the identified virus, but do not rule out bacterial infection or co-infection with other pathogens not detected by the test.  Clinical correlation with patient history and  other diagnostic information is necessary to determine patient infection status.  The expected result is negative.  Fact Sheet for Patients:   BoilerBrush.com.cy   Fact Sheet for Healthcare Providers:   https://pope.com/    This test is not yet approved or cleared by the [place] FDA and  has been authorized for detection and/or diagnosis of SARS-CoV-2 by FDA under an Emergency Use Authorization (EUA).  This EUA will remain in effect (meaning this tes t can be used) for the duration of  the COVID-19 declaration under Section 564(b)(1) of the Act, 21 U.S.C. section 360-bbb-3(b)(1), unless the authorization is  terminated or revoked sooner.  Performed at [facility], [address] [place], [place] [address]   Blood Culture (routine x 2)     Status: None   Collection Time: 10/27/2019  6:01 PM   Specimen: BLOOD   Result Value Ref Range Status   Specimen Description BLOOD RH  Final   Special Requests   Final    BOTTLES DRAWN AEROBIC AND ANAEROBIC Blood Culture adequate volume   Culture   Final    NO GROWTH 5 DAYS Performed at [facility], [address] [place], [place] [address]    Report Status 10/27/2019 FINAL  Final  MRSA PCR Screening     Status: None   Collection Time: 10/23/19  7:43 AM   Specimen: Nasal Mucosa; Nasopharyngeal  Result Value Ref Range Status   MRSA by PCR NEGATIVE NEGATIVE Final    Comment:        The GeneXpert MRSA Assay (FDA approved for NASAL specimens only), is one component of a comprehensive MRSA colonization surveillance program. It is not intended to diagnose MRSA infection nor to guide or monitor treatment for MRSA infections. Performed at [facility], [address] [place], [place] [address]   Culture, respiratory     Status: None (Preliminary result)   Collection Time: 10/31/19  5:41 AM   Specimen: Tracheal Aspirate; Respiratory  Result Value Ref Range Status   Specimen Description   Final    TRACHEAL ASPIRATE Performed at [facility], [address], [place], [place] [address]    Special Requests   Final    NONE Performed at [facility], [address] [place], [place] [address]    Gram Stain   Final    RARE WBC PRESENT, PREDOMINANTLY PMN RARE YEAST Performed at [facility], [address] [place], [place] [address]    Culture PENDING  Incomplete   Report Status PENDING  Incomplete  CULTURE, BLOOD (ROUTINE X 2) w Reflex to ID Panel     Status: None (Preliminary result)   Collection Time: 10/31/19  6:43 AM   Specimen: BLOOD  Result Value Ref Range Status   Specimen Description BLOOD RIGHT WRIST  Final   Special Requests   Final    BOTTLES DRAWN AEROBIC AND ANAEROBIC Blood Culture adequate volume   Culture   Final    NO GROWTH 1 DAY Performed at [facility], [address] [place], [place] [address]    Report Status PENDING  Incomplete  CULTURE, BLOOD (ROUTINE X 2) w Reflex to ID Panel     Status: None (Preliminary result)   Collection Time: 10/31/19  1:08 PM   Specimen: BLOOD  Result Value Ref Range Status   Specimen Description BLOOD RIGHT HAND  Final   Special Requests   Final    BOTTLES DRAWN AEROBIC AND ANAEROBIC Blood Culture adequate volume   Culture   Final    NO GROWTH < 24 HOURS Performed at [facility], [address], [place], [place] [address]    Report Status PENDING  Incomplete     Coagulation Studies: Recent Labs    10/30/19 0358 10/31/19 0402 11/01/19 0336  LABPROT 32.0* 29.4* 28.2*  INR 3.2* 2.9* 2.8*    Urinalysis: No results for input(s): COLORURINE, LABSPEC, PHURINE, GLUCOSEU, HGBUR, BILIRUBINUR, KETONESUR, PROTEINUR, UROBILINOGEN, NITRITE, LEUKOCYTESUR in the last 72 hours.  Invalid input(s): APPERANCEUR      Imaging: MR BRAIN WO CONTRAST  Result Date: 10/30/2019  CLINICAL DATA:  Cerebral hemorrhage suspected. EXAM: MRI HEAD WITHOUT CONTRAST TECHNIQUE: Multiplanar, multiecho pulse sequences of the brain and surrounding structures were obtained without intravenous contrast. COMPARISON:  None. FINDINGS: Brain: Acute lacunar infarct at the right globus pallidus. Small and more subacute appearing infarct in the bilateral cerebellum. No pre-existing ischemic injury. Partially empty sella with sellar expansion, nonspecific in isolation. No hemorrhage, hydrocephalus, or masslike finding. Vascular: Slow or absent flow at the distal left V4 segment based on flow voids. Skull and upper cervical spine: Normal marrow signal Sinuses/Orbits: Nasopharyngeal and nasal cavity fluid with bilateral mastoid and middle ear opacification in the setting of intubation. IMPRESSION: 1. Acute lacunar infarct at the right globus pallidus. 2. 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 3. No pre-existing ischemic  injury. 4. Slow or absent flow at the distal left V4 segment. 5. Nasopharyngeal and bilateral mastoid fluid in the setting of intubation. Electronically Signed   By: Marnee Spring M.D.   On: 10/30/2019 13:04   DG Chest Port 1 View  Result Date: 11/01/2019 CLINICAL DATA:  Pneumothorax EXAM: PORTABLE CHEST 1 VIEW COMPARISON:  One day prior FINDINGS: Patient rotated to the left. The superior most aspect of the chest is excluded. Numerous leads and wires project over the chest. Left internal jugular line tip at high SVC. Mild motion degradation. Feeding tube terminates over the left upper quadrant, possibly within the gastric fundus. More superiorly positioned today than on the prior. Endotracheal tube is poorly visualized and evaluated. Mild cardiomegaly. No pleural fluid. No convincing evidence of residual right sided pneumothorax, given minimal exclusion of the upper chest. Low lung volumes with persistent bibasilar airspace disease. Suspect mild pulmonary venous congestion. IMPRESSION: Similar aeration, with low lung volumes, bibasilar airspace disease, and probable pulmonary venous congestion. Although the superior most aspect of the chest is minimally excluded, there is no convincing evidence of residual right-sided pneumothorax. Support apparatus suboptimally evaluated, as detailed above. Feeding tube terminates over the left upper quadrant, possibly within the gastric fundus. Consider dedicated abdominal radiograph. Electronically Signed   By: Jeronimo Greaves M.D.   On: 11/01/2019 08:04   DG Chest Port 1 View  Result Date: 10/31/2019 CLINICAL DATA:  Hypoxia EXAM: PORTABLE CHEST 1 VIEW COMPARISON:  October 27, 2019 FINDINGS: Endotracheal tube tip is 4.4 cm above the carina. Feeding tube tip is in the stomach. Central catheter tip in the left innominate vein near the junction with the superior vena cava. There is a minimal right apical pneumothorax without appreciable tension component. There is bibasilar  atelectasis. Lungs elsewhere are clear. Heart size and pulmonary vascularity are normal. No adenopathy. No pneumothorax. IMPRESSION: Rather minimal right apical pneumothorax without tension component. Tube and catheter positions as described. Atelectatic change in the bases. Stable cardiac silhouette. These results will be called to the ordering clinician or representative by the Radiologist Assistant, and communication documented in the PACS or [employer]. Electronically Signed   By: Bretta Bang III M.D.   On: 10/31/2019 07:59     Assessment & Plan: Pt is a 59 y.o. African-American  male with Medical problems of diabetes hypertension and obesity , was admitted on 10/10/2019 with Acute respiratory failure with hypoxia (HCC) [J96.01] Acute respiratory failure due to COVID-19 (HCC) [U07.1, J96.00] Pneumonia due to COVID-19 virus [U07.1, J12.82] , was on non invasive ventilation initially, but required mechanical ventilation on 10/26/19 due to severe ARDS. Currently on FIO2 60%.   # Acute renal failure Patient sustained multiorgan failure, including renal  failure.  AKI likely secondary to ATN CRRT started 10/27/2019 -Urine output only 185 cc over the preceding 24 hours.  Therefore we will maintain the patient on CRRT at this time.  Continue to check serum electrolytes closely.  #Acute respiratory failure, secondary to COVID-19 pneumonia Patient remains on the ventilator at this time.  Does not appear to be ready to wean.  #Hyperkalemia Potassium rising and currently 5.9.  Patient switched to a 2K bath.  #Severe hyperphosphatemia Phosphorus a bit higher today at 5.2.  Rate of dialysate increased.  #Shock liver INR currently 2.8.  Continue to monitor per ICU team.  Patient is Jehovah's witness With multiple severe comorbidities, overall prognosis appears poor   LOS: 10 Danica Camarena 8/24/20218:27 AM    Note: This note was prepared with Dragon dictation. Any transcription  errors are unintentional

## 2019-11-01 NOTE — Progress Notes (Signed)
Assisted wife with tele-visit via elink 

## 2019-11-02 ENCOUNTER — Inpatient Hospital Stay: Payer: 59

## 2019-11-02 DIAGNOSIS — Z7189 Other specified counseling: Secondary | ICD-10-CM

## 2019-11-02 DIAGNOSIS — Z66 Do not resuscitate: Secondary | ICD-10-CM

## 2019-11-02 DIAGNOSIS — Z515 Encounter for palliative care: Secondary | ICD-10-CM

## 2019-11-02 DIAGNOSIS — E119 Type 2 diabetes mellitus without complications: Secondary | ICD-10-CM

## 2019-11-02 LAB — CBC WITH DIFFERENTIAL/PLATELET
Abs Immature Granulocytes: 6.02 10*3/uL — ABNORMAL HIGH (ref 0.00–0.07)
Basophils Absolute: 0.1 10*3/uL (ref 0.0–0.1)
Basophils Relative: 0 %
Eosinophils Absolute: 0.2 10*3/uL (ref 0.0–0.5)
Eosinophils Relative: 0 %
HCT: 23.9 % — ABNORMAL LOW (ref 39.0–52.0)
Hemoglobin: 8.4 g/dL — ABNORMAL LOW (ref 13.0–17.0)
Immature Granulocytes: 14 %
Lymphocytes Relative: 2 %
Lymphs Abs: 1 10*3/uL (ref 0.7–4.0)
MCH: 26.9 pg (ref 26.0–34.0)
MCHC: 35.1 g/dL (ref 30.0–36.0)
MCV: 76.6 fL — ABNORMAL LOW (ref 80.0–100.0)
Monocytes Absolute: 3.3 10*3/uL — ABNORMAL HIGH (ref 0.1–1.0)
Monocytes Relative: 8 %
Neutro Abs: 32.1 10*3/uL — ABNORMAL HIGH (ref 1.7–7.7)
Neutrophils Relative %: 76 %
Platelets: 299 10*3/uL (ref 150–400)
RBC: 3.12 MIL/uL — ABNORMAL LOW (ref 4.22–5.81)
RDW: 15 % (ref 11.5–15.5)
Smear Review: NORMAL
WBC: 42.7 10*3/uL — ABNORMAL HIGH (ref 4.0–10.5)
nRBC: 0.1 % (ref 0.0–0.2)

## 2019-11-02 LAB — CULTURE, RESPIRATORY W GRAM STAIN

## 2019-11-02 LAB — RENAL FUNCTION PANEL
Albumin: 2.1 g/dL — ABNORMAL LOW (ref 3.5–5.0)
Anion gap: 8 (ref 5–15)
BUN: 62 mg/dL — ABNORMAL HIGH (ref 6–20)
CO2: 25 mmol/L (ref 22–32)
Calcium: 8.1 mg/dL — ABNORMAL LOW (ref 8.9–10.3)
Chloride: 103 mmol/L (ref 98–111)
Creatinine, Ser: 2.94 mg/dL — ABNORMAL HIGH (ref 0.61–1.24)
GFR calc Af Amer: 26 mL/min — ABNORMAL LOW (ref >60)
GFR calc non Af Amer: 23 mL/min — ABNORMAL LOW (ref >60)
Glucose, Bld: 176 mg/dL — ABNORMAL HIGH (ref 70–99)
Phosphorus: 2.8 mg/dL (ref 2.5–4.6)
Potassium: 4.1 mmol/L (ref 3.5–5.1)
Sodium: 136 mmol/L (ref 135–145)

## 2019-11-02 LAB — BLOOD GAS, ARTERIAL
Acid-base deficit: 2.9 mmol/L — ABNORMAL HIGH (ref 0.0–2.0)
Bicarbonate: 24.6 mmol/L (ref 20.0–28.0)
FIO2: 1
MECHVT: 550 mL
Mechanical Rate: 30
O2 Saturation: 92.2 %
PEEP: 8 cmH2O
Patient temperature: 37
RATE: 30 resp/min
pCO2 arterial: 56 mmHg — ABNORMAL HIGH (ref 32.0–48.0)
pH, Arterial: 7.25 — ABNORMAL LOW (ref 7.350–7.450)
pO2, Arterial: 65 mmHg — ABNORMAL LOW (ref 83.0–108.0)

## 2019-11-02 LAB — GLUCOSE, CAPILLARY
Glucose-Capillary: 109 mg/dL — ABNORMAL HIGH (ref 70–99)
Glucose-Capillary: 126 mg/dL — ABNORMAL HIGH (ref 70–99)
Glucose-Capillary: 138 mg/dL — ABNORMAL HIGH (ref 70–99)
Glucose-Capillary: 172 mg/dL — ABNORMAL HIGH (ref 70–99)
Glucose-Capillary: 175 mg/dL — ABNORMAL HIGH (ref 70–99)
Glucose-Capillary: 176 mg/dL — ABNORMAL HIGH (ref 70–99)
Glucose-Capillary: 179 mg/dL — ABNORMAL HIGH (ref 70–99)
Glucose-Capillary: 180 mg/dL — ABNORMAL HIGH (ref 70–99)
Glucose-Capillary: 181 mg/dL — ABNORMAL HIGH (ref 70–99)
Glucose-Capillary: 183 mg/dL — ABNORMAL HIGH (ref 70–99)
Glucose-Capillary: 188 mg/dL — ABNORMAL HIGH (ref 70–99)
Glucose-Capillary: 190 mg/dL — ABNORMAL HIGH (ref 70–99)

## 2019-11-02 LAB — PROTIME-INR
INR: 1.4 — ABNORMAL HIGH (ref 0.8–1.2)
Prothrombin Time: 16.2 seconds — ABNORMAL HIGH (ref 11.4–15.2)

## 2019-11-02 LAB — MAGNESIUM: Magnesium: 2.3 mg/dL (ref 1.7–2.4)

## 2019-11-02 LAB — TRIGLYCERIDES: Triglycerides: 195 mg/dL — ABNORMAL HIGH (ref <150)

## 2019-11-02 LAB — PROCALCITONIN: Procalcitonin: 3.38 ng/mL

## 2019-11-02 MED ORDER — PRISMASOL BGK 4/2.5 32-4-2.5 MEQ/L IV SOLN: INTRAVENOUS | Status: DC

## 2019-11-02 MED ORDER — STERILE WATER FOR INJECTION IJ SOLN
INTRAMUSCULAR | Status: AC
  Administered 2019-11-02: 10 mL
  Filled 2019-11-02: qty 10

## 2019-11-02 MED ORDER — ATROPINE SULFATE 1 MG/10ML IJ SOSY
PREFILLED_SYRINGE | INTRAMUSCULAR | Status: AC
  Administered 2019-11-02: 1 mg
  Filled 2019-11-02: qty 10

## 2019-11-02 MED ORDER — PRISMASOL BGK 4/2.5 32-4-2.5 MEQ/L REPLACEMENT SOLN
Status: DC
  Filled 2019-11-02: qty 5000

## 2019-11-02 MED ORDER — HEPARIN SODIUM (PORCINE) 1000 UNIT/ML DIALYSIS
1000.0000 [IU] | INTRAMUSCULAR | Status: DC | PRN
  Administered 2019-11-02: 1000 [IU] via INTRAVENOUS_CENTRAL
  Filled 2019-11-02: qty 6
  Filled 2019-11-02: qty 3
  Filled 2019-11-02: qty 6

## 2019-11-05 LAB — CULTURE, BLOOD (ROUTINE X 2)
Culture: NO GROWTH
Culture: NO GROWTH
Special Requests: ADEQUATE
Special Requests: ADEQUATE

## 2019-11-09 NOTE — Progress Notes (Signed)
[address] [place], [place] [address] Phone [phone]. Fax [phone]  Date: 10/29/2019                  Patient Name:  Spencer Woodward  MRN: [id number]  DOB: Nov 01, 1960  Age / Sex: 59 y.o., male         PCP: Revelo, Presley Raddle, MD                 Service Requesting Consult: IM/ Erin Fulling, MD                 Reason for Consult: ARF            History of Present Illness: Patient is a 59 y.o. male with medical problems of DM,HTN , Obesity, who was admitted to [facility] on 10-25-2019 for evaluation of SOB, generalized malaise for 2 days prior to the presentation, got diagnosed with COVID pneumonia.Initially patient was placed on BIPAP, but due to severe ARDS, required intubation and mechanical ventilation since 10/26/19. Patient deteriorated with multiorgan failure including renal failure, started on CRRT on 10/27/2019.  Update:  Case discussed with critical care.  May be moving towards comfort care.  Multiple CVAs noted.  Urine output only 150 cc over the preceding 24 hours.  CRRT was paused for short period of time.  Currently febrile with a temperature of 101.1.   Vital Signs: Blood pressure 118/62, pulse (!) 128, temperature (!) 101.1 F (38.4 C), temperature source Esophageal, resp. rate 14, height 6\' 3"  (1.905 m), weight (!) 145.8 kg, SpO2 94 %.   Intake/Output Summary (Last 24 hours) at 11/01/2019 0816 Last data filed at 10/11/2019 0700 Gross per 24 hour  Intake 3676.08 ml  Output 3762 ml  Net -85.92 ml    Weight trends: Filed Weights   10/31/19 0500 11/01/19 0325 10/10/2019 0454  Weight: (!) 143.7 kg (!) 141.8 kg (!) 145.8 kg    Physical Exam: General:  Critically ill appearing, intubated and sedated  HEENT  ETT in place, NGT, conjunctival drainage and injection  Lungs:  vent dependent  Heart::  Tachycardic  Abdomen:  Nondistended  Extremities:  1+ edema  Neurologic:  Sedated   Skin:  no rashes or lesions noted  Access:  Left IJ temp cath   Foley:  In place       Lab results: Basic Metabolic Panel: Recent Labs  Lab 10/30/19 1647 10/30/19 1647 10/31/19 0402 10/31/19 0402 10/31/19 1541 10/31/19 2219 11/01/19 0336 11/01/19 0336 11/01/19 1153 11/01/19 1626 11/01/2019 0415 10/09/2019 0420  NA 138   < > 135   < > 135  --  132*  --   --  137 136  --   K 3.7   < > 4.3   < > 5.4*   < > 5.9*   < > 4.4 5.1 4.1  --   CL 102   < > 101   < > 101  --  98  --   --  103 103  --   CO2 25   < > 22   < > 24  --  24  --   --  24 25  --   GLUCOSE 218*   < > 324*   < > 370*  --  430*  --   --  135* 176*  --   BUN 82*   < > 75*   < > 65*  --  69*  --   --  68* 62*  --  CREATININE 4.90*   < > 4.20*   < > 3.63*  --  3.62*  --   --  3.66* 2.94*  --   CALCIUM 8.1*   < > 7.8*   < > 7.1*  --  7.3*  --   --  7.8* 8.1*  --   MG 2.6*  --  2.8*  --   --   --   --   --   --   --   --  2.3  PHOS 3.8   < >  --   --  5.2*  --   --   --   --  4.6 2.8  --    < > = values in this interval not displayed.    Liver Function Tests: Recent Labs  Lab 10/28/19 0341 10/28/19 1617 11/01/2019 0415  AST 96*  --   --   ALT 109*  --   --   ALKPHOS 95  --   --   BILITOT 1.6*  --   --   PROT 5.9*  --   --   ALBUMIN 2.2*   < > 2.1*   < > = values in this interval not displayed.   No results for input(s): LIPASE, AMYLASE in the last 168 hours. No results for input(s): AMMONIA in the last 168 hours.  CBC: Recent Labs  Lab 11/01/19 0336 10/11/2019 0420  WBC 34.9* 42.7*  NEUTROABS 29.7* 32.1*  HGB 8.9* 8.4*  HCT 24.7* 23.9*  MCV 77.9* 76.6*  PLT 250 299    Cardiac Enzymes: Recent Labs  Lab 10/27/19 1630  CKTOTAL 1,935*    BNP: Invalid input(s): POCBNP  CBG: Recent Labs  Lab 10/21/2019 0302 10/26/2019 0405 10/18/2019 0513 10/24/2019 0614 10/29/2019 0717  GLUCAP 172* 188* 181* 180* 183*    Microbiology: Recent Results (from the past 720 hour(s))  Blood Culture (routine x 2)     Status: None   Collection Time: 11/14/19  4:55 PM   Specimen:  BLOOD  Result Value Ref Range Status   Specimen Description BLOOD BLOOD LEFT FOREARM  Final   Special Requests   Final    BOTTLES DRAWN AEROBIC AND ANAEROBIC Blood Culture results may not be optimal due to an excessive volume of blood received in culture bottles   Culture   Final    NO GROWTH 5 DAYS Performed at [facility], [address] [place], [place] [address]    Report Status 10/27/2019 FINAL  Final  SARS Coronavirus 2 by RT PCR (hospital order, performed in [facility] hospital lab) Nasopharyngeal Nasopharyngeal Swab     Status: Abnormal   Collection Time: 11-14-2019  5:03 PM   Specimen: Nasopharyngeal Swab  Result Value Ref Range Status   SARS Coronavirus 2 POSITIVE (A) NEGATIVE Final    Comment: RESULT CALLED TO, READ BACK BY AND VERIFIED WITH: APRIL BRUMGARD Nov 14, 2019 AT 2027 HS (NOTE) SARS-CoV-2 target nucleic acids are DETECTED  SARS-CoV-2 RNA is generally detectable in upper respiratory specimens  during the acute phase of infection.  Positive results are indicative  of the presence of the identified virus, but do not rule out bacterial infection or co-infection with other pathogens not detected by the test.  Clinical correlation with patient history and  other diagnostic information is necessary to determine patient infection status.  The expected result is negative.  Fact Sheet for Patients:   BoilerBrush.com.cy   Fact Sheet for Healthcare Providers:   https://pope.com/    This  test is not yet approved or cleared by the [place] and  has been authorized for detection and/or diagnosis of SARS-CoV-2 by FDA under an Emergency Use Authorization (EUA).  This EUA will remain in effect (meaning this tes t can be used) for the duration of  the COVID-19 declaration under Section 564(b)(1) of the Act, 21 U.S.C. section 360-bbb-3(b)(1), unless the authorization is terminated or revoked sooner.  Performed at  [facility], [address] [place], [place] [address]   Blood Culture (routine x 2)     Status: None   Collection Time: 10/16/2019  6:01 PM   Specimen: BLOOD  Result Value Ref Range Status   Specimen Description BLOOD RH  Final   Special Requests   Final    BOTTLES DRAWN AEROBIC AND ANAEROBIC Blood Culture adequate volume   Culture   Final    NO GROWTH 5 DAYS Performed at [facility], [address] [place], [place] [address]    Report Status 10/27/2019 FINAL  Final  MRSA PCR Screening     Status: None   Collection Time: 10/23/19  7:43 AM   Specimen: Nasal Mucosa; Nasopharyngeal  Result Value Ref Range Status   MRSA by PCR NEGATIVE NEGATIVE Final    Comment:        The GeneXpert MRSA Assay (FDA approved for NASAL specimens only), is one component of a comprehensive MRSA colonization surveillance program. It is not intended to diagnose MRSA infection nor to guide or monitor treatment for MRSA infections. Performed at [facility], [address] [place], [place] [address]   Culture, respiratory     Status: None (Preliminary result)   Collection Time: 10/31/19  5:41 AM   Specimen: Tracheal Aspirate; Respiratory  Result Value Ref Range Status   Specimen Description   Final    TRACHEAL ASPIRATE Performed at [facility], [address] [place], [place] [address]    Special Requests   Final    NONE Performed at [facility], [address] [place], [place] [address]    Gram Stain   Final    RARE WBC PRESENT, PREDOMINANTLY PMN RARE YEAST Performed at [facility], [address], [place], [place] [address]    Culture FEW ENTEROBACTER AEROGENES  Final   Report Status PENDING  Incomplete  CULTURE, BLOOD (ROUTINE X 2) w Reflex to ID Panel     Status: None (Preliminary result)   Collection Time: 10/31/19  6:43 AM   Specimen: BLOOD  Result Value Ref Range Status   Specimen Description BLOOD RIGHT WRIST  Final    Special Requests   Final    BOTTLES DRAWN AEROBIC AND ANAEROBIC Blood Culture adequate volume   Culture   Final    NO GROWTH 2 DAYS Performed at [facility], [address] [place], [place] [address]    Report Status PENDING  Incomplete  CULTURE, BLOOD (ROUTINE X 2) w Reflex to ID Panel     Status: None (Preliminary result)   Collection Time: 10/31/19  1:08 PM   Specimen: BLOOD  Result Value Ref Range Status   Specimen Description BLOOD RIGHT HAND  Final   Special Requests   Final    BOTTLES DRAWN AEROBIC AND ANAEROBIC Blood Culture adequate volume   Culture   Final    NO GROWTH 2 DAYS Performed at [facility], [address], [place], [place] [address]    Report Status PENDING  Incomplete  Coagulation Studies: Recent Labs    10/31/19 0402 11/01/19 0336 10/19/2019 0420  LABPROT 29.4* 28.2* 16.2*  INR 2.9* 2.8* 1.4*    Urinalysis: No results for input(s): COLORURINE, LABSPEC, PHURINE, GLUCOSEU, HGBUR, BILIRUBINUR, KETONESUR, PROTEINUR, UROBILINOGEN, NITRITE, LEUKOCYTESUR in the last 72 hours.  Invalid input(s): APPERANCEUR      Imaging: DG Chest Port 1 View  Result Date: 10/13/2019 CLINICAL DATA:  Acute respiratory failure.  COVID. EXAM: PORTABLE CHEST 1 VIEW COMPARISON:  Yesterday FINDINGS: Endotracheal tube with tip at the clavicular heads. Left-sided central line with tip near the SVC brachiocephalic confluence. Feeding tube with tip over the left base, unchanged and presumably at the gastric fundus. The tip of the tube projects superior to the splenic flexure. Multifocal pneumonia with more prominent left perihilar opacity. No visible effusion or pneumothorax. Stable generous heart size IMPRESSION: 1. Stable hardware positioning. 2. Multifocal pneumonia with increased left perihilar density. Electronically Signed   By: Marnee SpringJonathon  Watts M.D.   On: 10/19/2019 06:26   DG Chest Port 1 View  Result Date: 11/01/2019 CLINICAL DATA:  Pneumothorax EXAM:  PORTABLE CHEST 1 VIEW COMPARISON:  One day prior FINDINGS: Patient rotated to the left. The superior most aspect of the chest is excluded. Numerous leads and wires project over the chest. Left internal jugular line tip at high SVC. Mild motion degradation. Feeding tube terminates over the left upper quadrant, possibly within the gastric fundus. More superiorly positioned today than on the prior. Endotracheal tube is poorly visualized and evaluated. Mild cardiomegaly. No pleural fluid. No convincing evidence of residual right sided pneumothorax, given minimal exclusion of the upper chest. Low lung volumes with persistent bibasilar airspace disease. Suspect mild pulmonary venous congestion. IMPRESSION: Similar aeration, with low lung volumes, bibasilar airspace disease, and probable pulmonary venous congestion. Although the superior most aspect of the chest is minimally excluded, there is no convincing evidence of residual right-sided pneumothorax. Support apparatus suboptimally evaluated, as detailed above. Feeding tube terminates over the left upper quadrant, possibly within the gastric fundus. Consider dedicated abdominal radiograph. Electronically Signed   By: Jeronimo GreavesKyle  Talbot M.D.   On: 11/01/2019 08:04   ECHOCARDIOGRAM COMPLETE  Result Date: 11/01/2019    ECHOCARDIOGRAM REPORT   Patient Name:   Peggye FormCLINT Samara Date of Exam: 11/01/2019 Medical Rec #:  [id number]    Height:       75.0 in Accession #:    [id number]   Weight:       312.6 lb Date of Birth:  09/07/1960     BSA:          2.653 m Patient Age:    58 years     BP:           110/50 mmHg Patient Gender: M            HR:           122 bpm. Exam Location:  [facility] Procedure: 2D Echo, Color Doppler and Cardiac Doppler Indications:     R06.89 Acute Respiratory Insufficiency  History:         Patient has no prior history of Echocardiogram examinations.                  Risk Factors:Diabetes, Hypertension and Dyslipidemia. Pt tested                  positive for COVID-19  on 01-27-20.  Sonographer:     Humphrey RollsJoan Heiss RDCS (AE) Referring Phys:  [id number] Erin FullingKURIAN KASA Diagnosing Phys:  Yvonne Kendall MD  Sonographer Comments: Technically difficult study due to poor echo windows and echo performed with patient supine and on artificial respirator. Image acquisition challenging due to patient body habitus. IMPRESSIONS  1. Left ventricular ejection fraction, by estimation, is >55%. The left ventricle has normal function. Left ventricular endocardial border not optimally defined to evaluate regional wall motion. There is mild left ventricular hypertrophy. Left ventricular diastolic parameters are indeterminate.  2. Right ventricular systolic function is mildly reduced. The right ventricular size is moderately enlarged. Tricuspid regurgitation signal is inadequate for assessing PA pressure.  3. The mitral valve is grossly normal. Trivial mitral valve regurgitation. No evidence of mitral stenosis.  4. The aortic valve is tricuspid. Aortic valve regurgitation is not visualized. No aortic stenosis is present.  5. Aortic dilatation noted. There is mild dilatation of the aortic root measuring 39 mm. FINDINGS  Left Ventricle: Left ventricular ejection fraction, by estimation, is >55%. The left ventricle has normal function. Left ventricular endocardial border not optimally defined to evaluate regional wall motion. The left ventricular internal cavity size was  normal in size. There is mild left ventricular hypertrophy. Left ventricular diastolic parameters are indeterminate. Right Ventricle: The right ventricular size is moderately enlarged. Right vetricular wall thickness was not assessed. Right ventricular systolic function is mildly reduced. Tricuspid regurgitation signal is inadequate for assessing PA pressure. Left Atrium: Left atrial size was not well visualized. Right Atrium: Right atrial size was not well visualized. Pericardium: The pericardium was not well visualized. Mitral Valve: The mitral  valve is grossly normal. Trivial mitral valve regurgitation. No evidence of mitral valve stenosis. MV peak gradient, 2.7 mmHg. The mean mitral valve gradient is 2.0 mmHg. Tricuspid Valve: The tricuspid valve is grossly normal. Tricuspid valve regurgitation is trivial. Aortic Valve: The aortic valve is tricuspid. Aortic valve regurgitation is not visualized. No aortic stenosis is present. Mild aortic valve annular calcification. Aortic valve mean gradient measures 5.0 mmHg. Aortic valve peak gradient measures 10.0 mmHg. Aortic valve area, by VTI measures 2.46 cm. Pulmonic Valve: The pulmonic valve was not well visualized. Pulmonic valve regurgitation is not visualized. No evidence of pulmonic stenosis. Aorta: Aortic dilatation noted. There is mild dilatation of the aortic root measuring 39 mm. Pulmonary Artery: The pulmonary artery is not well seen. Venous: The inferior vena cava was not well visualized. IAS/Shunts: The interatrial septum was not well visualized.  LEFT VENTRICLE PLAX 2D LVIDd:         3.60 cm  Diastology LVIDs:         2.72 cm  LV e' lateral:   10.20 cm/s LV PW:         1.22 cm  LV E/e' lateral: 6.2 LV IVS:        1.18 cm  LV e' medial:    9.57 cm/s LVOT diam:     2.00 cm  LV E/e' medial:  6.6 LV SV:         45 LV SV Index:   17 LVOT Area:     3.14 cm  LEFT ATRIUM         Index LA diam:    3.50 cm 1.32 cm/m  AORTIC VALVE                    PULMONIC VALVE AV Area (Vmax):    2.21 cm     PV Vmax:       1.08 m/s AV Area (Vmean):   2.62 cm  PV Vmean:      79.200 cm/s AV Area (VTI):     2.46 cm     PV VTI:        0.125 m AV Vmax:           158.00 cm/s  PV Peak grad:  4.7 mmHg AV Vmean:          102.000 cm/s PV Mean grad:  3.0 mmHg AV VTI:            0.184 m AV Peak Grad:      10.0 mmHg AV Mean Grad:      5.0 mmHg LVOT Vmax:         111.00 cm/s LVOT Vmean:        85.100 cm/s LVOT VTI:          0.144 m LVOT/AV VTI ratio: 0.78  AORTA Ao Root diam: 3.90 cm MITRAL VALVE MV Area (PHT): 6.07 cm     SHUNTS MV Peak grad:  2.7 mmHg    Systemic VTI:  0.14 m MV Mean grad:  2.0 mmHg    Systemic Diam: 2.00 cm MV Vmax:       0.82 m/s MV Vmean:      60.0 cm/s MV Decel Time: 125 msec MV E velocity: 63.00 cm/s MV A velocity: 69.80 cm/s MV E/A ratio:  0.90 Cristal Deer End MD Electronically signed by Yvonne Kendall MD Signature Date/Time: 11/01/2019/5:35:35 PM    Final      Assessment & Plan: Pt is a 59 y.o. African-American  male with Medical problems of diabetes hypertension and obesity , was admitted on Nov 03, 2019 with Acute respiratory failure with hypoxia (HCC) [J96.01] Acute respiratory failure due to COVID-19 (HCC) [U07.1, J96.00] Pneumonia due to COVID-19 virus [U07.1, J12.82] , was on non invasive ventilation initially, but required mechanical ventilation on 10/26/19 due to severe ARDS.    # Acute renal failure Patient sustained multiorgan failure, including renal failure.  AKI likely secondary to ATN CRRT started 10/27/2019 -Oligoanuria persists at this time.  Urine output was only 150 cc the last 24 hours.  For now we will maintain the patient on CRRT.  I have instructed nursing to increase ultrafiltration to net -50 cc/h.  #Acute respiratory failure, secondary to COVID-19 pneumonia Patient requiring 100% FiO2 at the moment.  Prognosis quite guarded.  #Hyperkalemia Potassium down to 4.1 with 2K bath.  We will switch back to a 4K bath now.  #Severe hyperphosphatemia Phosphorus now normalized at 2.8.  #Shock liver INR down to 1.4.  Patient is Jehovah's witness With multiple severe comorbidities, overall prognosis appears poor   LOS: 11 Lavida Patch 8/25/20218:16 AM    Note: This note was prepared with Dragon dictation. Any transcription errors are unintentional

## 2019-11-09 NOTE — Progress Notes (Signed)
The Clinical status was relayed to family in detail. Wife over the phone   Updated and notified of patients medical condition.  Patient remains unresponsive and will not open eyes to command.    patient with increased WOB and using accessory muscles to breathe Explained to family course of therapy and the modalities     Patient with Progressive multiorgan failure with very low chance of meaningful recovery despite all aggressive and optimal medical therapy.  Patient is in the dying  Process associated with suffering.  Family understands the situation.  They have consented and agreed to DNR/DNI.   Family are satisfied with Plan of action and management. All questions answered  Additional CC time 32 mins   Jamae Tison Santiago Glad, M.D.  [facility]  Medical Director [facility] Medical Director [facility] Cardio-Pulmonary Department

## 2019-11-09 NOTE — Progress Notes (Signed)
Pt expired and he was extubated per Dr. Clovis Fredrickson order.

## 2019-11-09 NOTE — Progress Notes (Signed)
CRITICAL CARE NOTE  59 year old male with PMH significant for uncontrolled type 2 DM, HLD, HTN, Thyroid nodule, gout, morbid obesity and pulmonary embolism presented to [facility] ER on 08/14 via EMS from home with generalized malaise, shortness of breath and chest discomfort with inspiration. Admitted with acute hypoxic respiratory failure secondary to COVID-19 Pneumonia. On high flow O2 alternating with BiPAP  SIGNIFICANT EVENTS 08/14: Pt admitted tostepdown unit with COVID pneumonia 08/16:Remains housed in EDP due to no rooms. 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  8/23remains on vent, on CRRT, multiorgan failure, CXR small RT sided Apical PTX  STUDIES: 8/14:Chest XrayshowsInterval development of patchy bilateral airspace opacities which may represent pneumonia 8/14: CTA Chest Slightly suboptimal opacification of the main pulmonary artery, however no central or proximal segmental pulmonary embolism.Extensive multifocal airspace opacities, consistent with atypical viral pneumonia  CULTURES: 8/14:BCxpending  ANTIBIOTICS: Ceftriaxone 8/14>8/23 Azithromycin8/14>8/23  CC  follow up respiratory failure  SUBJECTIVE Patient remains critically ill Prognosis is guarded No OUP Remains on CRRT +CVA  Patient with severe hypoxia, fio2 100% CXR reviewed no sign changes, high likelihood of PE    BP 118/62 (BP Location: Right Arm)   Pulse (!) 128   Temp (!) 101.1 F (38.4 C) (Esophageal)   Resp 14   Ht 6\' 3"  (1.905 m)   Wt (!) 145.8 kg   SpO2 94%   BMI 40.18 kg/m    I/O last 3 completed shifts: In: 5204.3 [I.V.:3544.3;  Other:30; NG/GT:1580; IV Piggyback:50] Out: 5323 [Urine:165; Other:5158] No intake/output data recorded.  SpO2: 94 % O2 Flow Rate (L/min): 50 L/min FiO2 (%): 100 %  Estimated body mass index is 40.18 kg/m as calculated from the following:   Height as of this encounter: 6\' 3"  (1.905 m).   Weight as of this encounter: 145.8 kg.     REVIEW OF SYSTEMS  PATIENT IS UNABLE TO PROVIDE COMPLETE REVIEW OF SYSTEMS DUE TO SEVERE CRITICAL ILLNESS      COVID-19 DISASTER DECLARATION:   FULL CONTACT PHYSICAL EXAMINATION WAS NOT POSSIBLE DUE TO TREATMENT OF COVID-19  AND CONSERVATION OF PERSONAL PROTECTIVE EQUIPMENT, LIMITED EXAM FINDINGS INCLUDE-   PHYSICAL EXAMINATION:  GENERAL:critically ill appearing, +resp distress NEUROLOGIC: obtunded, GCS<8   Patient assessed or the symptoms described in the history of present illness.  In the context of the Global COVID-19 pandemic, which necessitated consideration that the patient might be at risk for infection with the SARS-CoV-2 virus that causes COVID-19, Institutional protocols and algorithms that pertain to the evaluation of patients at risk for COVID-19 are in a state of rapid change based on information released by regulatory bodies including the CDC and federal and state organizations. These policies and algorithms were followed during the patient's care while in hospital.    MEDICATIONS: I have reviewed all medications and confirmed regimen as documented   CULTURE RESULTS   Recent Results (from the past 240 hour(s))  MRSA PCR Screening     Status: None   Collection Time: 10/23/19  7:43 AM   Specimen: Nasal Mucosa; Nasopharyngeal  Result Value Ref Range Status   MRSA by PCR NEGATIVE NEGATIVE Final    Comment:        The GeneXpert MRSA Assay (FDA approved  for NASAL specimens only), is one component of a comprehensive MRSA colonization surveillance program. It is not intended to diagnose MRSA infection nor to guide or monitor  treatment for MRSA infections. Performed at [facility], [address] [place], [place] [address]   Culture, respiratory     Status: None (Preliminary result)   Collection Time: 10/31/19  5:41 AM   Specimen: Tracheal Aspirate; Respiratory  Result Value Ref Range Status   Specimen Description   Final    TRACHEAL ASPIRATE Performed at [facility], [address] [place], [place] [address]    Special Requests   Final    NONE Performed at [facility], [address] [place], [place] [address]    Gram Stain   Final    RARE WBC PRESENT, PREDOMINANTLY PMN RARE YEAST Performed at [facility], [address], [place], [place] [address]    Culture FEW ENTEROBACTER AEROGENES  Final   Report Status PENDING  Incomplete  CULTURE, BLOOD (ROUTINE X 2) w Reflex to ID Panel     Status: None (Preliminary result)   Collection Time: 10/31/19  6:43 AM   Specimen: BLOOD  Result Value Ref Range Status   Specimen Description BLOOD RIGHT WRIST  Final   Special Requests   Final    BOTTLES DRAWN AEROBIC AND ANAEROBIC Blood Culture adequate volume   Culture   Final    NO GROWTH 2 DAYS Performed at [facility], [address], [place], [place] [address]    Report Status PENDING  Incomplete  CULTURE, BLOOD (ROUTINE X 2) w Reflex to ID Panel     Status: None (Preliminary result)   Collection Time: 10/31/19  1:08 PM   Specimen: BLOOD  Result Value Ref Range Status   Specimen Description BLOOD RIGHT HAND  Final   Special Requests   Final    BOTTLES DRAWN AEROBIC AND ANAEROBIC Blood Culture adequate volume   Culture   Final    NO GROWTH 2 DAYS Performed at [facility], [address] [place], [place] [address]    Report Status PENDING  Incomplete          IMAGING    DG Chest Port 1 View  Result Date: 11/17/19 CLINICAL DATA:  Acute respiratory failure.  COVID. EXAM: PORTABLE CHEST 1 VIEW COMPARISON:  Yesterday FINDINGS:  Endotracheal tube with tip at the clavicular heads. Left-sided central line with tip near the SVC brachiocephalic confluence. Feeding tube with tip over the left base, unchanged and presumably at the gastric fundus. The tip of the tube projects superior to the splenic flexure. Multifocal pneumonia with more prominent left perihilar opacity. No visible effusion or pneumothorax. Stable generous heart size IMPRESSION: 1. Stable hardware positioning. 2. Multifocal pneumonia with increased left perihilar density. Electronically Signed   By: Marnee Spring M.D.   On: 2019-11-17 06:26   ECHOCARDIOGRAM COMPLETE  Result Date: 11/01/2019    ECHOCARDIOGRAM REPORT   Patient Name:   YEE JOSS Date of Exam: 11/01/2019 Medical Rec #:  [id number]    Height:       75.0 in Accession #:    [id number]   Weight:       312.6 lb Date of Birth:  [birth year]     BSA:          2.653 m Patient Age:    58 years     BP:           110/50 mmHg Patient  Gender: M            HR:           122 bpm. Exam Location:  [facility] Procedure: 2D Echo, Color Doppler and Cardiac Doppler Indications:     R06.89 Acute Respiratory Insufficiency  History:         Patient has no prior history of Echocardiogram examinations.                  Risk Factors:Diabetes, Hypertension and Dyslipidemia. Pt tested                  positive for COVID-19 on 10/12/2019.  Sonographer:     Humphrey Rolls RDCS (AE) Referring Phys:  [id number] Erin Fulling Diagnosing Phys: Yvonne Kendall MD  Sonographer Comments: Technically difficult study due to poor echo windows and echo performed with patient supine and on artificial respirator. Image acquisition challenging due to patient body habitus. IMPRESSIONS  1. Left ventricular ejection fraction, by estimation, is >55%. The left ventricle has normal function. Left ventricular endocardial border not optimally defined to evaluate regional wall motion. There is mild left ventricular hypertrophy. Left ventricular diastolic parameters are indeterminate.   2. Right ventricular systolic function is mildly reduced. The right ventricular size is moderately enlarged. Tricuspid regurgitation signal is inadequate for assessing PA pressure.  3. The mitral valve is grossly normal. Trivial mitral valve regurgitation. No evidence of mitral stenosis.  4. The aortic valve is tricuspid. Aortic valve regurgitation is not visualized. No aortic stenosis is present.  5. Aortic dilatation noted. There is mild dilatation of the aortic root measuring 39 mm. FINDINGS  Left Ventricle: Left ventricular ejection fraction, by estimation, is >55%. The left ventricle has normal function. Left ventricular endocardial border not optimally defined to evaluate regional wall motion. The left ventricular internal cavity size was  normal in size. There is mild left ventricular hypertrophy. Left ventricular diastolic parameters are indeterminate. Right Ventricle: The right ventricular size is moderately enlarged. Right vetricular wall thickness was not assessed. Right ventricular systolic function is mildly reduced. Tricuspid regurgitation signal is inadequate for assessing PA pressure. Left Atrium: Left atrial size was not well visualized. Right Atrium: Right atrial size was not well visualized. Pericardium: The pericardium was not well visualized. Mitral Valve: The mitral valve is grossly normal. Trivial mitral valve regurgitation. No evidence of mitral valve stenosis. MV peak gradient, 2.7 mmHg. The mean mitral valve gradient is 2.0 mmHg. Tricuspid Valve: The tricuspid valve is grossly normal. Tricuspid valve regurgitation is trivial. Aortic Valve: The aortic valve is tricuspid. Aortic valve regurgitation is not visualized. No aortic stenosis is present. Mild aortic valve annular calcification. Aortic valve mean gradient measures 5.0 mmHg. Aortic valve peak gradient measures 10.0 mmHg. Aortic valve area, by VTI measures 2.46 cm. Pulmonic Valve: The pulmonic valve was not well visualized. Pulmonic  valve regurgitation is not visualized. No evidence of pulmonic stenosis. Aorta: Aortic dilatation noted. There is mild dilatation of the aortic root measuring 39 mm. Pulmonary Artery: The pulmonary artery is not well seen. Venous: The inferior vena cava was not well visualized. IAS/Shunts: The interatrial septum was not well visualized.  LEFT VENTRICLE PLAX 2D LVIDd:         3.60 cm  Diastology LVIDs:         2.72 cm  LV e' lateral:   10.20 cm/s LV PW:         1.22 cm  LV E/e' lateral: 6.2 LV IVS:  1.18 cm  LV e' medial:    9.57 cm/s LVOT diam:     2.00 cm  LV E/e' medial:  6.6 LV SV:         45 LV SV Index:   17 LVOT Area:     3.14 cm  LEFT ATRIUM         Index LA diam:    3.50 cm 1.32 cm/m  AORTIC VALVE                    PULMONIC VALVE AV Area (Vmax):    2.21 cm     PV Vmax:       1.08 m/s AV Area (Vmean):   2.62 cm     PV Vmean:      79.200 cm/s AV Area (VTI):     2.46 cm     PV VTI:        0.125 m AV Vmax:           158.00 cm/s  PV Peak grad:  4.7 mmHg AV Vmean:          102.000 cm/s PV Mean grad:  3.0 mmHg AV VTI:            0.184 m AV Peak Grad:      10.0 mmHg AV Mean Grad:      5.0 mmHg LVOT Vmax:         111.00 cm/s LVOT Vmean:        85.100 cm/s LVOT VTI:          0.144 m LVOT/AV VTI ratio: 0.78  AORTA Ao Root diam: 3.90 cm MITRAL VALVE MV Area (PHT): 6.07 cm    SHUNTS MV Peak grad:  2.7 mmHg    Systemic VTI:  0.14 m MV Mean grad:  2.0 mmHg    Systemic Diam: 2.00 cm MV Vmax:       0.82 m/s MV Vmean:      60.0 cm/s MV Decel Time: 125 msec MV E velocity: 63.00 cm/s MV A velocity: 69.80 cm/s MV E/A ratio:  0.90 Christopher End MD Electronically signed by Yvonne Kendall MD Signature Date/Time: 11/01/2019/5:35:35 PM    Final      Nutrition Status: Nutrition Problem: Inadequate oral intake Etiology: inability to eat Signs/Symptoms: NPO status Interventions: Refer to RD note for recommendations     Indwelling Urinary Catheter continued, requirement due to   Reason to continue Indwelling  Urinary Catheter strict Intake/Output monitoring for hemodynamic instability   Central Line/ continued, requirement due to  Reason to continue [employer] Monitoring of central venous pressure or other hemodynamic parameters and poor IV access   Ventilator continued, requirement due to severe respiratory failure   Ventilator Sedation RASS 0 to -2      ASSESSMENT AND PLAN SYNOPSIS  Acute hypoxemic respiratory failure due to COVID-19 pneumonia / ARDS Mechanical ventilation via ARDS protocol, target PRVC 6 cc/kg Wean PEEP and FiO2 as able Goal plateau pressure less than 30, driving pressure less than 15 Paralytics if necessary for vent synchrony, gas exchange Cycle prone positioning if necessary for oxygenation Deep sedation per PAD protocol, goal RASS -4, currently fentanyl, midazolam Diuresis as blood pressure and renal function can tolerate, goal CVP 5-8.   diuresis as tolerated based on Kidney function VAP prevention order set Remdesivir +BARIC IV STEROIDS  Follow inflammatory markers: Ferritin, D-dimer, CRP, IL-6, LDH Vitamin C, zinc Plan to repeat and check resp cultures    Severe ACUTE Hypoxic  and Hypercapnic Respiratory Failure Severe worsening hypoxia last 12 hrs High likelihood of PE, high risk for bleeding if started on anticoagulation -continue Full MV support -continue Bronchodilator Therapy -Wean Fio2 and PEEP as tolerated -VAP/VENT bundle implementation   Morbid obesity, possible OSA.   Will certainly impact respiratory mechanics, ventilator weaning Suspect will need to consider additional PEEP   ACUTE KIDNEY INJURY/Renal Failure -continue Foley Catheter-assess need -Avoid nephrotoxic agents -Follow urine output, BMP -Ensure adequate renal perfusion, optimize oxygenation -Renal dose medications On CRRT    NEUROLOGY Acute toxic metabolic encephalopathy, need for sedation Goal RASS -2 to -3  SHOCK-SEPSIS -use vasopressors to keep MAP>65 -consider  stress dose steroids   CARDIAC ICU monitoring  ID -continue IV abx as prescibed -follow up cultures  GI GI PROPHYLAXIS as indicated   DIET-->TF's as tolerated Constipation protocol as indicated  ENDO - will use ICU hypoglycemic\Hyperglycemia protocol if indicated     ELECTROLYTES -follow labs as needed -replace as needed -pharmacy consultation and following   DVT/GI PRX ordered and assessed TRANSFUSIONS AS NEEDED MONITOR FSBS I Assessed the need for Labs I Assessed the need for Foley I Assessed the need for Central Venous Line Family Discussion when available I Assessed the need for Mobilization I made an Assessment of medications to be adjusted accordingly Safety Risk assessment completed   CASE DISCUSSED IN MULTIDISCIPLINARY ROUNDS WITH ICU TEAM  Critical Care Time devoted to patient care services described in this note is 36 minutes.   Overall, patient is critically ill, prognosis is guarded.  Patient with Multiorgan failure and at high risk for cardiac arrest and death.   Very poor chance of meaningful recovery, multiorgan failure.  Lucie LeatherKurian David Kalyna Paolella, M.D.  [facility]  Medical Director [facility] Medical Director [facility] Card[facility]

## 2019-11-09 NOTE — Progress Notes (Signed)
CRRT off from 0045 to 0145 for set change. Therapy restarted at 0145.

## 2019-11-09 NOTE — Progress Notes (Addendum)
Pt observed by writing RN with rapidly decreasing HR at 1328.  He initially appeared to become asysytolic but then IV rhythm in 20s was observed on the monitor.   Unable to palpate carotid pulse or auscultate heart tones.  This RN could not doppler a femoral pulse, second RN though she could auscultate a very faint rt fem pulse with the doppler.  Therefore Atropine was given, but no change was appreciated.  Monitor then observed with asystole.  BP not readable and blood in CRRT observed nearly black.  Neither RN could auscultate heart tones or doppler pulse.  Pt pronounced dead at 1358 by this RN and Fayrene Helper, Charity fundraiser.  Dr Belia Heman notified patient's wife.  I also called her to offer condolences.  She will call the [facility] when she finalizes plans.   Pt cleaned and placed in bag.

## 2019-11-09 NOTE — Plan of Care (Signed)
Pt turned Q2H this shift, bathed per policy.  No response to noxious stimuli, weak cough/gag present, no corneal reflexes observed, CRRT filter pressure rising significant;y start of shoft, rinsed back approx 0800, restarted w.new [employer] filter at 0900.  Anuric at this oint in shft, PFR rate to 50 cc/hr and bath changed to 4 K per Dr Cherylann Ratel.  Pt is now DNR per Kasa's conversation with patient's wife, purple band placed.  Neo requirement increased, O2 sats unable to maintain in 90s w/ 100% FIO2

## 2019-11-09 NOTE — Consult Note (Signed)
Consultation Note Date: 11/04/2019   Patient Name: Spencer Woodward  DOB: 05-17-1960  MRN: [id number]  Age / Sex: 59 y.o., male  PCP: Revelo, Elyse Jarvis, MD Referring Physician: Flora Lipps, MD  Reason for Consultation: Establishing goals of care  HPI/Patient Profile: 59 y.o. male  with past medical history of uncontrolled T2DM, HLD, HTN, thyroid nodule, gout, morbid obesity, and PE admitted on 11/04/2019 with shortness of breath. Diagnosed with COVID pna. He required intubation 8/18. CRRT initiated 8/19. 8/22 MRI revealed CVA.  PMT consulted to discuss [place].   Clinical Assessment and Goals of Care: I have reviewed medical records including EPIC notes, labs and imaging, received report from RN and Dr. Mortimer Fries, and then spoke with patient's wife, Spencer Woodward, via telephone to discuss diagnosis prognosis, [place], EOL wishes, disposition and options.  I introduced Palliative Medicine as specialized medical care for people living with serious illness. It focuses on providing relief from the symptoms and stress of a serious illness. The goal is to improve quality of life for both the patient and the family.  We discussed a brief life review of the patient. 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 She tells me he did have some chronic health conditions but was fully functional. Overall, she considered him healthy.    We discussed patient's current illness and what it means in the larger context of patient's on-going co-morbidities.  Natural disease trajectory and expectations at EOL were discussed. Spencer Woodward understands the patient is severely ill. We discussed poor prognosis r/t multi-organ failure.   The difference between aggressive medical intervention and comfort care was considered in light of the patient's goals of care. At this point, Spencer Woodward continues to be interested in full scope medical care. She  agreed to DNR status earlier today during conversation with Dr. Mortimer Fries. We discussed this further - discussed concern that if he were to arrest continued aggressive measures would not contribute to quality of life. Spencer Woodward agrees.   We discuss concern that patient will not survive hospitalization. Spencer Woodward expresses understanding.   Discussed with Spencer Woodward the importance of continued conversation with family and the medical providers regarding overall plan of care and treatment options, ensuring decisions are within the context of the patients values and GOCs.    Questions and concerns were addressed. The family was encouraged to call with questions or concerns.   Primary Decision Maker NEXT OF KIN - Wife Spencer Woodward  SUMMARY OF RECOMMENDATIONS   Ongoing [place] discussion PMT will continue to support Educated wife on severity of illness/poor prognosis/expectations  Code Status/Advance Care Planning:  DNR per conversation with Dr. Mortimer Fries  Prognosis:   Unable to determine  Discharge Planning: To Be Determined      Primary Diagnoses: Present on Admission:  Acute respiratory failure due to COVID-19 [facility])  HTN (hypertension)   I have reviewed the medical record, interviewed the patient and family, and examined the patient. The following aspects are pertinent.  Past Medical History:  Diagnosis Date   Diabetes mellitus without complication ([place])    Social History   Socioeconomic History   Marital status: Married    Spouse name: Not on file   Number of children: Not on file   Years of education: Not on file   Highest education level: Not on file  Occupational History   Not on file  Tobacco Use   Smoking status: Never Smoker   Smokeless tobacco: Never Used  Vaping Use  Vaping Use: Never used  Substance and Sexual Activity   Alcohol use: No    Comment: social   Drug use: No   Sexual activity: Not Currently  Other Topics Concern   Not on file  Social  History Narrative   Not on file   Social Determinants of Health   Financial Resource Strain:    Difficulty of Paying Living Expenses: Not on file  Food Insecurity:    Worried About [place] in the Last Year: Not on file   Ran Out of Food in the Last Year: Not on file  Transportation Needs:    Lack of Transportation (Medical): Not on file   Lack of Transportation (Non-Medical): Not on file  Physical Activity:    Days of Exercise per Week: Not on file   Minutes of Exercise per Session: Not on file  Stress:    Feeling of Stress : Not on file  Social Connections:    Frequency of Communication with Friends and Family: Not on file   Frequency of Social Gatherings with Friends and Family: Not on file   Attends Religious Services: Not on file   Active Member of Clubs or Organizations: Not on file   Attends Archivist Meetings: Not on file   Marital Status: Not on file   Family History  Problem Relation Age of Onset   Diabetes Mother    Heart disease Father    Alcohol abuse Father    Prostate cancer Neg Hx    Bladder Cancer Neg Hx    Kidney cancer Neg Hx    Scheduled Meds:  vitamin C  500 mg Per Tube Daily   atropine       chlorhexidine gluconate (MEDLINE KIT)  15 mL Mouth Rinse BID   Chlorhexidine Gluconate Cloth  6 each Topical Daily   clonazepam  0.5 mg Per Tube BID   colchicine  0.3 mg Per Tube Daily   docusate  100 mg Per Tube BID   feeding supplement (ENSURE ENLIVE)  90 mL Per Tube BID   hydrocortisone sod succinate (SOLU-CORTEF) inj  50 mg Intravenous Q6H   mouth rinse  15 mL Mouth Rinse 10 times per day   multivitamin with minerals  1 tablet Per Tube Daily   pantoprazole (PROTONIX) IV  40 mg Intravenous Q24H   polyethylene glycol  17 g Per Tube Daily   sodium chloride flush  10-40 mL Intracatheter Q12H   thiamine injection  100 mg Intravenous Daily   zinc sulfate  220 mg Per Tube Daily   Continuous  Infusions:   prismasol BGK 4/2.5 300 mL/hr at 2019/11/09 0855    prismasol BGK 4/2.5 300 mL/hr at 11-09-2019 0855   sodium chloride Stopped (11/09/2019 0303)   dexmedetomidine (PRECEDEX) IV infusion Stopped (November 09, 2019 1017)   dextrose 5% lactated ringers 125 mL/hr at 11-09-19 1300   feeding supplement (PIVOT 1.5 CAL) Stopped (11/01/19 1110)   fentaNYL infusion INTRAVENOUS 250 mcg/hr (2019/11/09 1300)   insulin 3.6 mL/hr at 2019/11/09 1300   phenylephrine (NEO-SYNEPHRINE) Adult infusion 250 mcg/min (11/09/19 1300)   prismasol BGK 4/2.5 2,000 mL/hr at 11-09-2019 0858   sodium chloride 0.9 % 1,000 mL with heparin sodium (porcine) 2,000 Units 999 mL/hr at 09-Nov-2019 0800   vasopressin 0.04 Units/min (Nov 09, 2019 1300)   PRN Meds:.sodium chloride, acetaminophen, artificial tears, fentaNYL (SUBLIMAZE) injection, heparin, heparin, ondansetron **OR** ondansetron (ZOFRAN) IV, sodium chloride 0.9 % 1,000 mL with heparin sodium (porcine) 2,000 Units, sodium chloride, sodium chloride  flush, vecuronium No Known Allergies Review of Systems  Unable to perform ROS: Intubated    Physical Exam Constitutional:      Comments: unresponsive  Cardiovascular:     Rate and Rhythm: Tachycardia present.  Pulmonary:     Comments: Intubated, 100% fiO2    Vital Signs: BP (!) 90/49    Pulse (!) 128    Temp 100.2 F (37.9 C)    Resp 18    Ht _0  (1.905 m)    Wt (!) 145.8 kg    SpO2 (!) 89%    BMI 40.18 kg/m  Pain Scale: CPOT   Pain Score: 0-No pain   SpO2: SpO2: (!) 89 % O2 Device:SpO2: (!) 89 % O2 Flow Rate: .O2 Flow Rate (L/min): 50 L/min  IO: Intake/output summary:   Intake/Output Summary (Last 24 hours) at 11-16-19 1401 Last data filed at 11/16/19 1300 Gross per 24 hour  Intake 4048.21 ml  Output 3968 ml  Net 80.21 ml    LBM: Last BM Date: 11/01/19 Baseline Weight: Weight: (!) 149.7 kg Most recent weight: Weight: (!) 145.8 kg     Palliative Assessment/Data: PPS 10%    Time Total: 52  minutes Greater than 50%  of this time was spent counseling and coordinating care related to the above assessment and plan.  Juel Burrow, DNP, AGNP-C Palliative Medicine Team [phone] Pager: [phone]

## 2019-11-09 NOTE — Death Summary Note (Signed)
DEATH SUMMARY   Patient Details  Name: Spencer Woodward MRN: [id number] DOB: 09-Jul-1960  Admission/Discharge Information   Admit Date:  11/03/2019  Date of Death:   11/14/19   Time of Death:  30-Jun-1356  Length of Stay: 07/01/22  Referring Physician: Preston Fleeting, MD   Reason(s) for Hospitalization  COVID 19 pneumonia  Diagnoses  Preliminary cause of death: COVID 19 pneumonia, ARDS, CVA, Secondary Diagnoses (including complications and co-morbidities):  Principal Problem:   Acute respiratory failure due to COVID-19 [facility]) Active Problems:   HTN (hypertension)   Diabetes mellitus without complication (HCC)   Obesity, Class III, BMI 40-49.9 (morbid obesity) (HCC)   Pneumonia due to COVID-19 virus   History of DVT (deep vein thrombosis)   Brief Hospital Course (including significant findings, care, treatment, and services provided and events leading to death)   59 year old male with PMH significant for uncontrolled type 2 DM, HLD, HTN, Thyroid nodule, gout, morbid obesity and pulmonary embolism presented to [facility] ER on 03-Nov-2022 via EMS from home with generalized malaise, shortness of breath and chest discomfort with inspiration. Admitted with acute hypoxic respiratory failure secondary to COVID-19 Pneumonia. On high flow O2 alternating with BiPAP  SIGNIFICANT EVENTS 2022-11-03: Pt admitted tostepdown unit with COVID pneumonia 08/16:Remains housed in EDP due to no rooms. Still stepdown status, remains BiPAP dependent 8/17remains on biPAP, on precedex 8/18Intubated, sedated, severe ARDS 8/52multiorgan failure, progressive hypoxia and shock a with renal failure, started CRRT 8/20remains in multiorgan failure, on pressors, on CRRT 8/21 severe resp failure, fio2 at 50% 8/22severe resp failure, on CRRT, no OUP+MRI shows CVA RT Cortex and B/L cerebellar  8/23remains on vent, on CRRT, multiorgan failure, CXR small RT sided Apical PTX  STUDIES: 8/14:Chest XrayshowsInterval  development of patchy bilateral airspace opacities which may represent pneumonia 03-Nov-2022: CTA Chest Slightly suboptimal opacification of the main pulmonary artery, however no central or proximal segmental pulmonary embolism.Extensive multifocal airspace opacities, consistent with atypical viral pneumonia   GOALS OF CARE CONVERSTATION The Clinical status was relayed to family in detail.  Updated and notified of patients medical condition.  Patient remains unresponsive and will not open eyes to command.   Upon assessment his breath sounds are course crackles with significant secretions to his oral pharyngeal region.  Nasopharyngeal suction produced copious sanguineous secretions.  Patient is having a weak cough and struggling to remove secretions.  patient with increased WOB and using accessory muscles to breathe Explained to family course of therapy and the modalities     Patient with Progressive multiorgan failure with very low chance of meaningful recovery despite all aggressive and optimal medical therapy.  Patient is in the dying  Process associated with suffering.  Family understands the situation.  They have consented and agreed to DNR/DNI  Family are satisfied with Plan of action and management. All questions answered  Patient subsequently passed awat 1358 Nov 14, 2019    Wife called and updated, Condolences relayed to family   Pertinent Labs and Studies  Significant Diagnostic Studies CT Angio Chest PE W and/or Wo Contrast  Result Date: 11/03/19 CLINICAL DATA:  Shortness of breath COVID positive EXAM: CT ANGIOGRAPHY CHEST WITH CONTRAST TECHNIQUE: Multidetector CT imaging of the chest was performed using the standard protocol during bolus administration of intravenous contrast. Multiplanar CT image reconstructions and MIPs were obtained to evaluate the vascular anatomy. CONTRAST:  93mL OMNIPAQUE IOHEXOL 350 MG/ML SOLN COMPARISON:  None. FINDINGS: Cardiovascular: There is slightly  suboptimal opacification of the main pulmonary artery. No central or proximal  segmental pulmonary embolism. The heart is normal in size. No pericardial effusion or thickening. No evidence right heart strain. There is normal three-vessel brachiocephalic anatomy without proximal stenosis. The thoracic aorta is normal in appearance. Mediastinum/Nodes: No hilar, mediastinal, or axillary adenopathy. There is a heterogeneously hypodense lesion seen within the right thyroid lobe measuring 4.3 x 3.1 cm. Lungs/Pleura: Extensive multifocal patchy airspace opacities are seen throughout both lungs. There are air bronchogram seen at both lung bases. No pleural effusion or pneumothorax. Upper Abdomen: No acute abnormalities present in the visualized portions of the upper abdomen. Musculoskeletal: No chest wall abnormality. No acute or significant osseous findings. Review of the MIP images confirms the above findings. IMPRESSION: Slightly suboptimal opacification of the main pulmonary artery, however no central or proximal segmental pulmonary embolism. Extensive multifocal airspace opacities, consistent with atypical viral pneumonia Heterogeneous right thyroid lobe lesion measuring 4.3 x 3.1 cm. Recommend thyroid US (ref: J Am Coll Radiol. 2015 Feb;12(2): 143-50). Electronically Signed   By: Jonna Clark M.D.   On: 11-05-19 21:23   MR BRAIN WO CONTRAST  Result Date: 10/30/2019 CLINICAL DATA:  Cerebral hemorrhage suspected. EXAM: MRI HEAD WITHOUT CONTRAST TECHNIQUE: Multiplanar, multiecho pulse sequences of the brain and surrounding structures were obtained without intravenous contrast. COMPARISON:  None. FINDINGS: Brain: Acute lacunar infarct at the right globus pallidus. Small and more subacute appearing infarct in the bilateral cerebellum. No pre-existing ischemic injury. Partially empty sella with sellar expansion, nonspecific in isolation. No hemorrhage, hydrocephalus, or masslike finding. Vascular: Slow or absent flow  at the distal left V4 segment based on flow voids. Skull and upper cervical spine: Normal marrow signal Sinuses/Orbits: Nasopharyngeal and nasal cavity fluid with bilateral mastoid and middle ear opacification in the setting of intubation. IMPRESSION: 1. Acute lacunar infarct at the right globus pallidus. 2. 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 3. No pre-existing ischemic injury. 4. Slow or absent flow at the distal left V4 segment. 5. Nasopharyngeal and bilateral mastoid fluid in the setting of intubation. Electronically Signed   By: Marnee Spring M.D.   On: 10/30/2019 13:04   DG Chest Port 1 View  Result Date: 10/09/2019 CLINICAL DATA:  Acute respiratory failure.  COVID. EXAM: PORTABLE CHEST 1 VIEW COMPARISON:  Yesterday FINDINGS: Endotracheal tube with tip at the clavicular heads. Left-sided central line with tip near the SVC brachiocephalic confluence. Feeding tube with tip over the left base, unchanged and presumably at the gastric fundus. The tip of the tube projects superior to the splenic flexure. Multifocal pneumonia with more prominent left perihilar opacity. No visible effusion or pneumothorax. Stable generous heart size IMPRESSION: 1. Stable hardware positioning. 2. Multifocal pneumonia with increased left perihilar density. Electronically Signed   By: Marnee Spring M.D.   On: 11/08/2019 06:26   DG Chest Port 1 View  Result Date: 11/01/2019 CLINICAL DATA:  Pneumothorax EXAM: PORTABLE CHEST 1 VIEW COMPARISON:  One day prior FINDINGS: Patient rotated to the left. The superior most aspect of the chest is excluded. Numerous leads and wires project over the chest. Left internal jugular line tip at high SVC. Mild motion degradation. Feeding tube terminates over the left upper quadrant, possibly within the gastric fundus. More superiorly positioned today than on the prior. Endotracheal tube is poorly visualized and evaluated. Mild cardiomegaly. No pleural fluid. No  convincing evidence of residual right sided pneumothorax, given minimal exclusion of the upper chest. Low lung volumes with persistent bibasilar airspace disease. Suspect mild pulmonary venous congestion. IMPRESSION: Similar aeration,  with low lung volumes, bibasilar airspace disease, and probable pulmonary venous congestion. Although the superior most aspect of the chest is minimally excluded, there is no convincing evidence of residual right-sided pneumothorax. Support apparatus suboptimally evaluated, as detailed above. Feeding tube terminates over the left upper quadrant, possibly within the gastric fundus. Consider dedicated abdominal radiograph. Electronically Signed   By: Jeronimo GreavesKyle  Talbot M.D.   On: 11/01/2019 08:04   DG Chest Port 1 View  Result Date: 10/31/2019 CLINICAL DATA:  Hypoxia EXAM: PORTABLE CHEST 1 VIEW COMPARISON:  October 27, 2019 FINDINGS: Endotracheal tube tip is 4.4 cm above the carina. Feeding tube tip is in the stomach. Central catheter tip in the left innominate vein near the junction with the superior vena cava. There is a minimal right apical pneumothorax without appreciable tension component. There is bibasilar atelectasis. Lungs elsewhere are clear. Heart size and pulmonary vascularity are normal. No adenopathy. No pneumothorax. IMPRESSION: Rather minimal right apical pneumothorax without tension component. Tube and catheter positions as described. Atelectatic change in the bases. Stable cardiac silhouette. These results will be called to the ordering clinician or representative by the Radiologist Assistant, and communication documented in the PACS or [employer] Dashboard. Electronically Signed   By: Bretta BangWilliam  Woodruff III M.D.   On: 10/31/2019 07:59   DG Chest Port 1 View  Result Date: 10/27/2019 CLINICAL DATA:  Acute respiratory failure. EXAM: PORTABLE CHEST 1 VIEW COMPARISON:  2019/10/28.  CT 2019/10/28. FINDINGS: Tracheostomy tube noted with tip 4 cm above the carina. PICC line noted  with tip over SVC. Heart size normal. Bilateral multifocal pulmonary infiltrates again noted. Interim improvement in aeration from prior exam. No pleural effusion or pneumothorax. Degenerative change thoracic spine. IMPRESSION: 1. Tracheostomy tube noted with tip 4 cm above the carina. PICC line noted with tip over SVC. 2. Bilateral multifocal pulmonary infiltrates again noted. Interim improvement in aeration from prior exam. Electronically Signed   By: Maisie Fushomas  Register   On: 10/27/2019 05:12   DG Chest Port 1 View  Result Date: Apr 03, 2019 CLINICAL DATA:  Possible COVID exposure. EXAM: PORTABLE CHEST 1 VIEW COMPARISON:  Chest radiograph December 07, 2018. FINDINGS: Exam limited secondary to technique and exposure. Stable cardiac and mediastinal contours. Interval development of patchy bilateral airspace opacities. No pleural effusion or pneumothorax. IMPRESSION: Interval development of patchy bilateral airspace opacities which may represent pneumonia. Recommend repeat chest radiograph with PA and lateral chest radiograph when patient clinically able. Electronically Signed   By: Annia Beltrew  Davis M.D.   On: 2019/10/28 17:17   DG Abd Portable 1V  Result Date: 10/28/2019 CLINICAL DATA:  Feeding tube placement EXAM: PORTABLE ABDOMEN - 1 VIEW COMPARISON:  None. FINDINGS: Feeding tube tip is in the proximal stomach. There is moderate stool in the colon. There is no bowel dilatation or air-fluid level to suggest bowel obstruction. No free air. Visualized lung bases clear. IMPRESSION: Feeding tube tip in proximal stomach. No bowel obstruction or free air. Electronically Signed   By: Bretta BangWilliam  Woodruff III M.D.   On: 10/28/2019 10:34   ECHOCARDIOGRAM COMPLETE  Result Date: 11/01/2019    ECHOCARDIOGRAM REPORT   Patient Name:   Peggye FormCLINT Kapaun Date of Exam: 11/01/2019 Medical Rec #:  [id number]    Height:       75.0 in Accession #:    [id number]   Weight:       312.6 lb Date of Birth:  06/13/1960     BSA:          2.653 m  Patient Age:    58 years     BP:           110/50 mmHg Patient Gender: M            HR:           122 bpm. Exam Location:  [facility] Procedure: 2D Echo, Color Doppler and Cardiac Doppler Indications:     R06.89 Acute Respiratory Insufficiency  History:         Patient has no prior history of Echocardiogram examinations.                  Risk Factors:Diabetes, Hypertension and Dyslipidemia. Pt tested                  positive for COVID-19 on 2019-11-05.  Sonographer:     Humphrey Rolls RDCS (AE) Referring Phys:  [id number] Erin Fulling Diagnosing Phys: Yvonne Kendall MD  Sonographer Comments: Technically difficult study due to poor echo windows and echo performed with patient supine and on artificial respirator. Image acquisition challenging due to patient body habitus. IMPRESSIONS  1. Left ventricular ejection fraction, by estimation, is >55%. The left ventricle has normal function. Left ventricular endocardial border not optimally defined to evaluate regional wall motion. There is mild left ventricular hypertrophy. Left ventricular diastolic parameters are indeterminate.  2. Right ventricular systolic function is mildly reduced. The right ventricular size is moderately enlarged. Tricuspid regurgitation signal is inadequate for assessing PA pressure.  3. The mitral valve is grossly normal. Trivial mitral valve regurgitation. No evidence of mitral stenosis.  4. The aortic valve is tricuspid. Aortic valve regurgitation is not visualized. No aortic stenosis is present.  5. Aortic dilatation noted. There is mild dilatation of the aortic root measuring 39 mm. FINDINGS  Left Ventricle: Left ventricular ejection fraction, by estimation, is >55%. The left ventricle has normal function. Left ventricular endocardial border not optimally defined to evaluate regional wall motion. The left ventricular internal cavity size was  normal in size. There is mild left ventricular hypertrophy. Left ventricular diastolic parameters are indeterminate.  Right Ventricle: The right ventricular size is moderately enlarged. Right vetricular wall thickness was not assessed. Right ventricular systolic function is mildly reduced. Tricuspid regurgitation signal is inadequate for assessing PA pressure. Left Atrium: Left atrial size was not well visualized. Right Atrium: Right atrial size was not well visualized. Pericardium: The pericardium was not well visualized. Mitral Valve: The mitral valve is grossly normal. Trivial mitral valve regurgitation. No evidence of mitral valve stenosis. MV peak gradient, 2.7 mmHg. The mean mitral valve gradient is 2.0 mmHg. Tricuspid Valve: The tricuspid valve is grossly normal. Tricuspid valve regurgitation is trivial. Aortic Valve: The aortic valve is tricuspid. Aortic valve regurgitation is not visualized. No aortic stenosis is present. Mild aortic valve annular calcification. Aortic valve mean gradient measures 5.0 mmHg. Aortic valve peak gradient measures 10.0 mmHg. Aortic valve area, by VTI measures 2.46 cm. Pulmonic Valve: The pulmonic valve was not well visualized. Pulmonic valve regurgitation is not visualized. No evidence of pulmonic stenosis. Aorta: Aortic dilatation noted. There is mild dilatation of the aortic root measuring 39 mm. Pulmonary Artery: The pulmonary artery is not well seen. Venous: The inferior vena cava was not well visualized. IAS/Shunts: The interatrial septum was not well visualized.  LEFT VENTRICLE PLAX 2D LVIDd:         3.60 cm  Diastology LVIDs:         2.72 cm  LV e' lateral:   10.20  cm/s LV PW:         1.22 cm  LV E/e' lateral: 6.2 LV IVS:        1.18 cm  LV e' medial:    9.57 cm/s LVOT diam:     2.00 cm  LV E/e' medial:  6.6 LV SV:         45 LV SV Index:   17 LVOT Area:     3.14 cm  LEFT ATRIUM         Index LA diam:    3.50 cm 1.32 cm/m  AORTIC VALVE                    PULMONIC VALVE AV Area (Vmax):    2.21 cm     PV Vmax:       1.08 m/s AV Area (Vmean):   2.62 cm     PV Vmean:      79.200 cm/s AV  Area (VTI):     2.46 cm     PV VTI:        0.125 m AV Vmax:           158.00 cm/s  PV Peak grad:  4.7 mmHg AV Vmean:          102.000 cm/s PV Mean grad:  3.0 mmHg AV VTI:            0.184 m AV Peak Grad:      10.0 mmHg AV Mean Grad:      5.0 mmHg LVOT Vmax:         111.00 cm/s LVOT Vmean:        85.100 cm/s LVOT VTI:          0.144 m LVOT/AV VTI ratio: 0.78  AORTA Ao Root diam: 3.90 cm MITRAL VALVE MV Area (PHT): 6.07 cm    SHUNTS MV Peak grad:  2.7 mmHg    Systemic VTI:  0.14 m MV Mean grad:  2.0 mmHg    Systemic Diam: 2.00 cm MV Vmax:       0.82 m/s MV Vmean:      60.0 cm/s MV Decel Time: 125 msec MV E velocity: 63.00 cm/s MV A velocity: 69.80 cm/s MV E/A ratio:  0.90 Cristal Deer End MD Electronically signed by Yvonne Kendall MD Signature Date/Time: 11/01/2019/5:35:35 PM    Final    [place] EKG SITE RITE  Result Date: 10/25/2019 If Site Rite image not attached, placement could not be confirmed due to current cardiac rhythm.   Microbiology Recent Results (from the past 240 hour(s))  Culture, respiratory     Status: None   Collection Time: 10/31/19  5:41 AM   Specimen: Tracheal Aspirate; Respiratory  Result Value Ref Range Status   Specimen Description   Final    TRACHEAL ASPIRATE Performed at [facility], [address], [place], [place] [address]    Special Requests   Final    NONE Performed at [facility], [address] [place], [place] [address]    Gram Stain   Final    RARE WBC PRESENT, PREDOMINANTLY PMN RARE YEAST Performed at [facility], [address], [place], [place] [address]    Culture FEW ENTEROBACTER AEROGENES  Final   Report Status 11/01/2019 FINAL  Final   Organism ID, Bacteria ENTEROBACTER AEROGENES  Final      Susceptibility   Enterobacter aerogenes - MIC*    CEFAZOLIN >=64 RESISTANT Resistant     CEFEPIME <=  0.12 SENSITIVE Sensitive     CEFTAZIDIME <=1 SENSITIVE Sensitive     CEFTRIAXONE <=0.25 SENSITIVE Sensitive     CIPROFLOXACIN  <=0.25 SENSITIVE Sensitive     GENTAMICIN <=1 SENSITIVE Sensitive     IMIPENEM 0.5 SENSITIVE Sensitive     TRIMETH/SULFA <=20 SENSITIVE Sensitive     PIP/TAZO <=4 SENSITIVE Sensitive     * FEW ENTEROBACTER AEROGENES  CULTURE, BLOOD (ROUTINE X 2) w Reflex to ID Panel     Status: None (Preliminary result)   Collection Time: 10/31/19  6:43 AM   Specimen: BLOOD  Result Value Ref Range Status   Specimen Description BLOOD RIGHT WRIST  Final   Special Requests   Final    BOTTLES DRAWN AEROBIC AND ANAEROBIC Blood Culture adequate volume   Culture   Final    NO GROWTH 2 DAYS Performed at [facility], [address] [place], [place] [address]    Report Status PENDING  Incomplete  CULTURE, BLOOD (ROUTINE X 2) w Reflex to ID Panel     Status: None (Preliminary result)   Collection Time: 10/31/19  1:08 PM   Specimen: BLOOD  Result Value Ref Range Status   Specimen Description BLOOD RIGHT HAND  Final   Special Requests   Final    BOTTLES DRAWN AEROBIC AND ANAEROBIC Blood Culture adequate volume   Culture   Final    NO GROWTH 2 DAYS Performed at [facility], [address] [place], [place] [address]    Report Status PENDING  Incomplete    Lab Basic Metabolic Panel: Recent Labs  Lab 10/28/19 0341 10/28/19 1617 10/30/19 0358 10/30/19 0358 10/30/19 1647 10/30/19 1647 10/31/19 0402 10/31/19 0402 10/31/19 1541 10/31/19 1541 10/31/19 2219 11/01/19 0336 11/01/19 1153 11/01/19 1626 11-03-19 0415 11-03-2019 0420  NA 144   < > 139   < > 138   < > 135  --  135  --   --  132*  --  137 136  --   K 5.8*   < > 3.9   < > 3.7   < > 4.3   < > 5.4*   < > 5.4* 5.9* 4.4 5.1 4.1  --   CL 111   < > 102   < > 102   < > 101  --  101  --   --  98  --  103 103  --   CO2 21*   < > 22   < > 25   < > 22  --  24  --   --  24  --  24 25  --   GLUCOSE 237*   < > 212*   < > 218*   < > 324*  --  370*  --   --  430*  --  135* 176*  --   BUN 74*   < > 84*   < > 82*   < > 75*  --  65*  --    --  69*  --  68* 62*  --   CREATININE 5.62*   < > 6.17*   < > 4.90*   < > 4.20*  --  3.63*  --   --  3.62*  --  3.66* 2.94*  --   CALCIUM 6.5*   < > 8.2*   < > 8.1*   < > 7.8*  --  7.1*  --   --  7.3*  --  7.8* 8.1*  --  MG 2.6*  --  2.8*  --  2.6*  --  2.8*  --   --   --   --   --   --   --   --  2.3  PHOS 7.8*   < > 4.6  --  3.8  --   --   --  5.2*  --   --   --   --  4.6 2.8  --    < > = values in this interval not displayed.   Liver Function Tests: Recent Labs  Lab 10/27/19 0555 10/27/19 1630 10/28/19 0341 10/28/19 1617 10/29/19 2054 10/30/19 1647 10/31/19 1541 11/01/19 1626 10/13/2019 0415  AST 156*  --  96*  --   --   --   --   --   --   ALT 148*  --  109*  --   --   --   --   --   --   ALKPHOS 135*  --  95  --   --   --   --   --   --   BILITOT 2.0*  --  1.6*  --   --   --   --   --   --   PROT 6.8  --  5.9*  --   --   --   --   --   --   ALBUMIN 2.6*   < > 2.2*   < > 2.3* 2.4* 2.2* 2.2* 2.1*   < > = values in this interval not displayed.   No results for input(s): LIPASE, AMYLASE in the last 168 hours. No results for input(s): AMMONIA in the last 168 hours. CBC: Recent Labs  Lab 10/28/19 0341 10/28/19 0341 10/29/19 0600 10/30/19 0358 10/31/19 0402 11/01/19 0336 10/14/2019 0420  WBC 19.2*   < > 24.1* 24.0* 30.8* 34.9* 42.7*  NEUTROABS 16.8*  --  21.1*  --  25.3* 29.7* 32.1*  HGB 10.4*   < > 9.9* 9.0* 8.9* 8.9* 8.4*  HCT 31.2*   < > 28.8* 24.9* 24.6* 24.7* 23.9*  MCV 79.8*   < > 78.9* 75.2* 74.5* 77.9* 76.6*  PLT 174   < > 138* 167 193 250 299   < > = values in this interval not displayed.   Cardiac Enzymes: Recent Labs  Lab 10/27/19 1630  CKTOTAL 1,935*   Sepsis Labs: Recent Labs  Lab 10/30/19 0358 10/31/19 0402 10/31/19 0525 11/01/19 0336 10/26/2019 0420  PROCALCITON  --   --  6.17 4.84 3.38  WBC 24.0* 30.8*  --  34.9* 42.7*      Ronald Londo 11/07/2019, 2:00 PM

## 2019-11-09 DEATH — deceased

## 2022-03-12 IMAGING — CT CT ANGIO CHEST
2 of 6 series · 18 of 46 positions shown · IV contrast (APPLIED)
Comparison: None.

CLINICAL DATA: Shortness of breath COVID positive

EXAM:
CT ANGIOGRAPHY CHEST WITH CONTRAST
TECHNIQUE: Multidetector CT imaging of the chest was performed using the
standard protocol during bolus administration of intravenous
contrast. Multiplanar CT image reconstructions and MIPs were
obtained to evaluate the vascular anatomy.
CONTRAST:  75mL OMNIPAQUE IOHEXOL 350 MG/ML SOLN

[Series 5: thins · axial · 0.76mm/px · z∈[-861,-565]mm · 16 of 326 slices shown]
[im 15/326  lung]
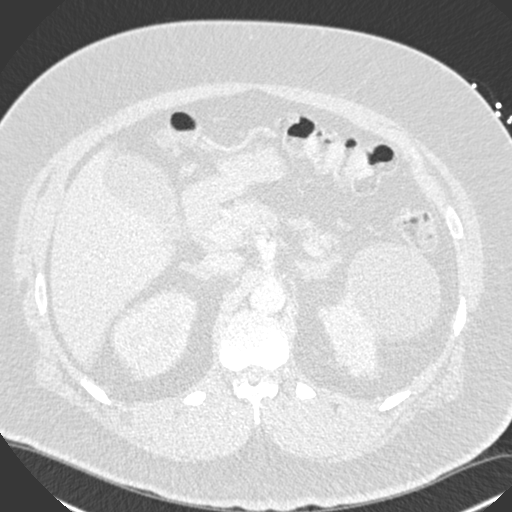
[im 43/326  soft-tissue]
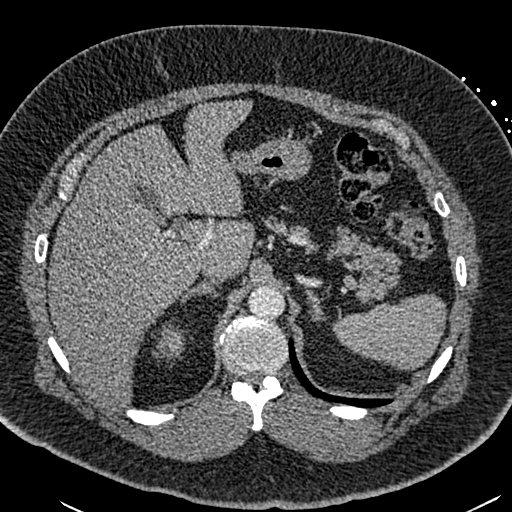
[im 57/326  lung]
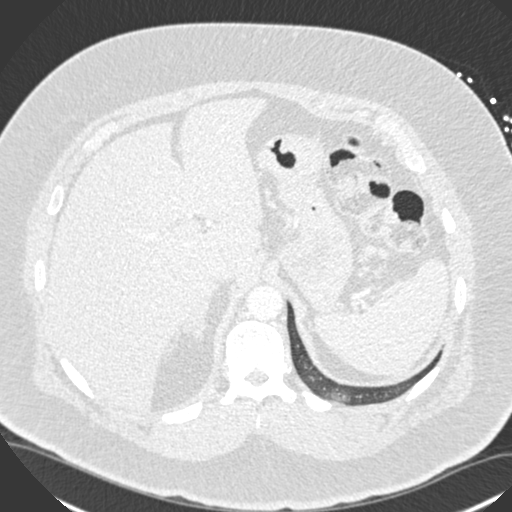
[im 71/326  soft-tissue]
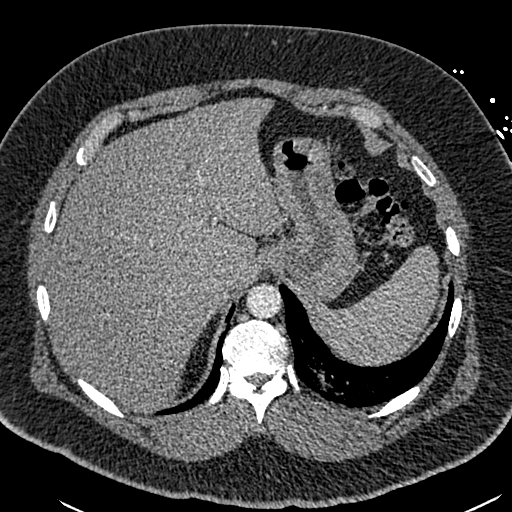
[im 99/326  lung]
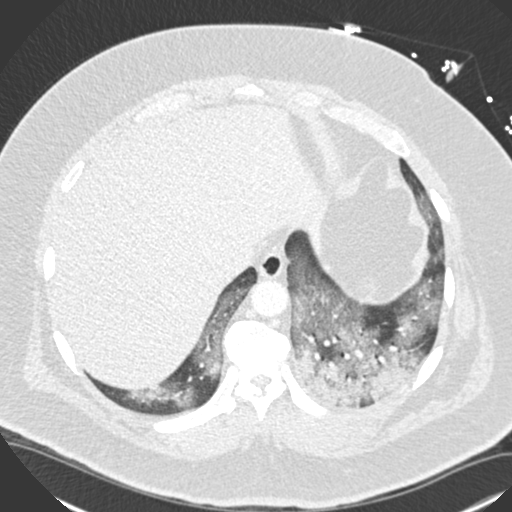
[im 114/326  soft-tissue]
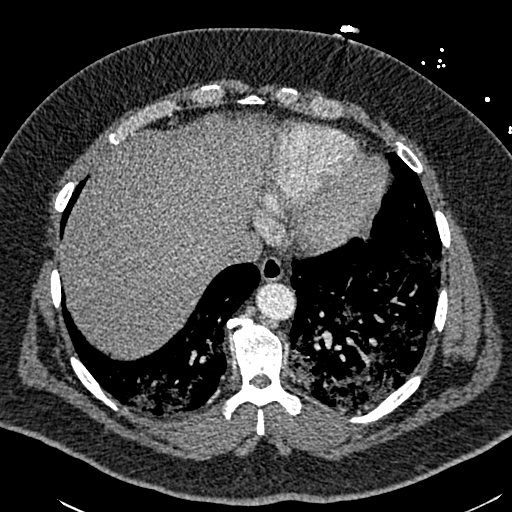
[im 128/326  lung]
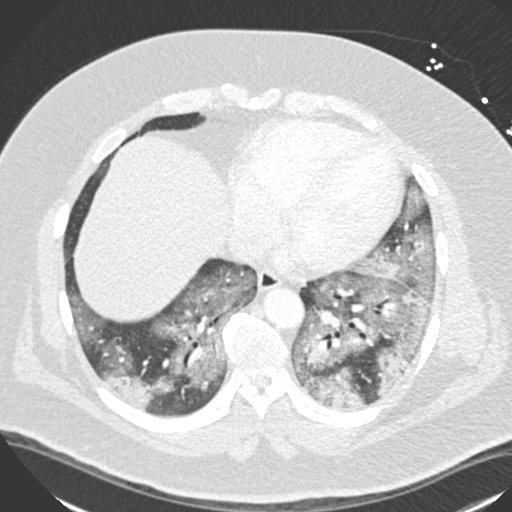
[im 156/326  soft-tissue]
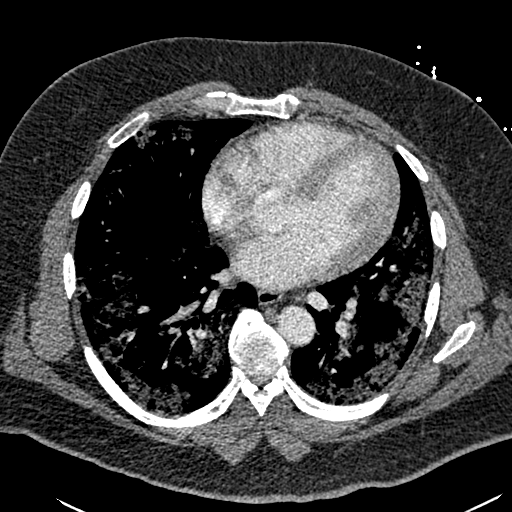
[im 170/326  lung]
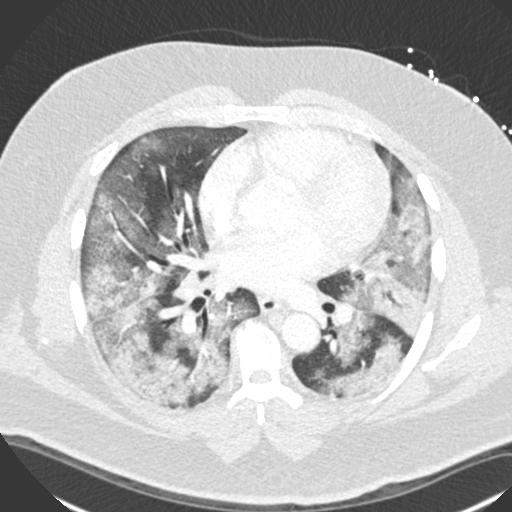
[im 198/326  soft-tissue]
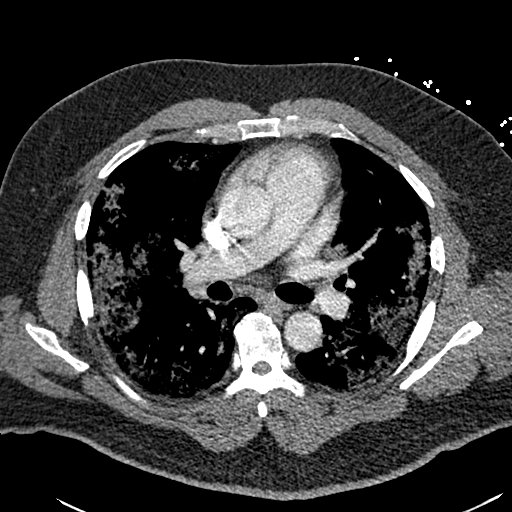
[im 212/326  lung]
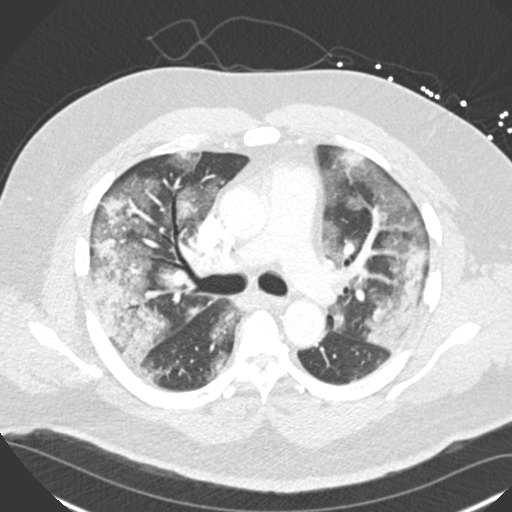
[im 227/326  soft-tissue]
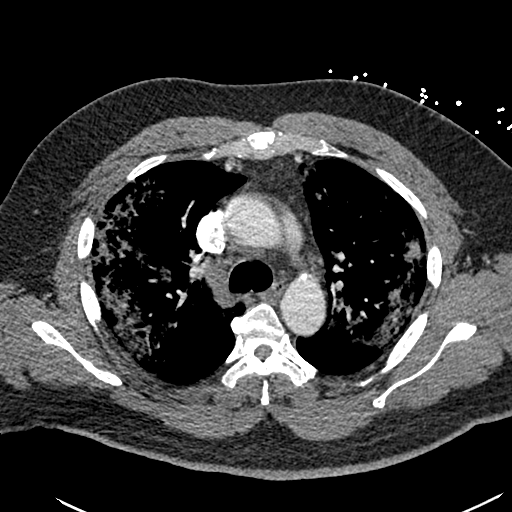
[im 255/326  lung]
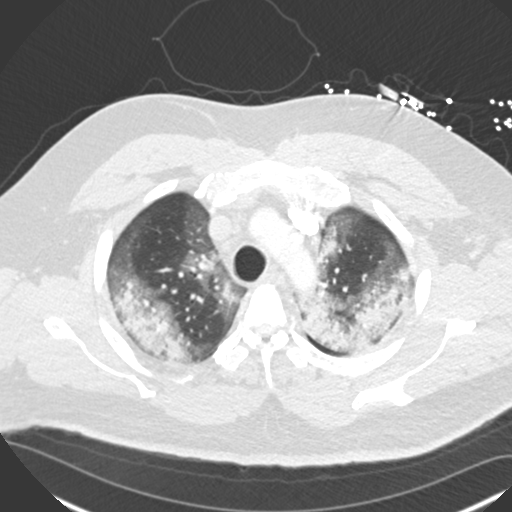
[im 269/326  soft-tissue]
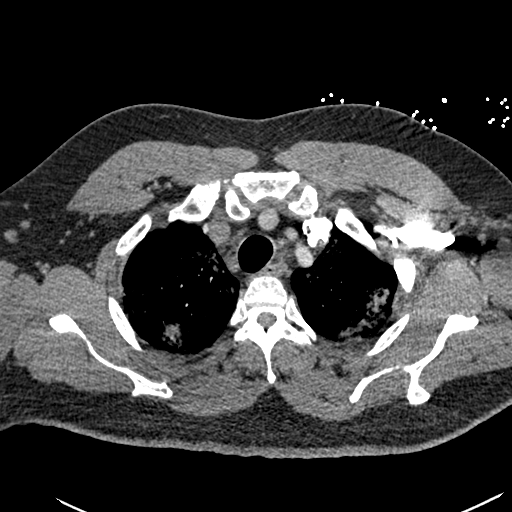
[im 283/326  lung]
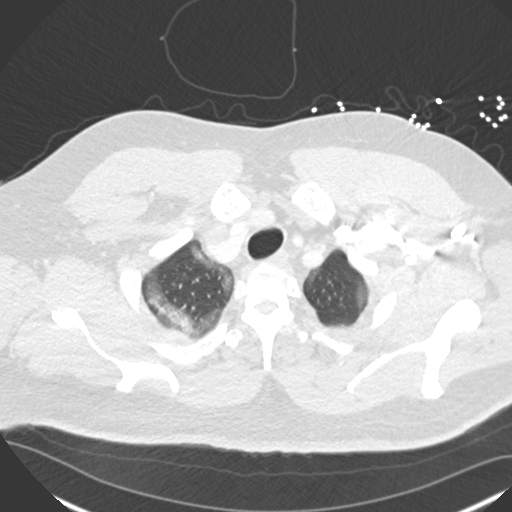
[im 311/326  soft-tissue]
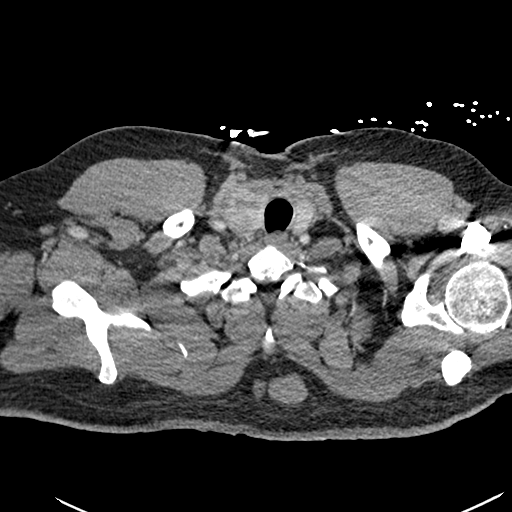

[Series 7: coronal mpr · coronal · 0.70mm/px · 2 of 104 slices shown]
[im 35/104  soft-tissue]
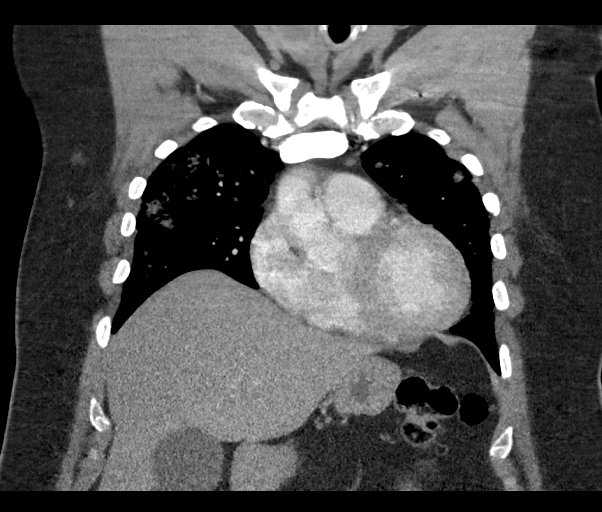
[im 69/104  soft-tissue]
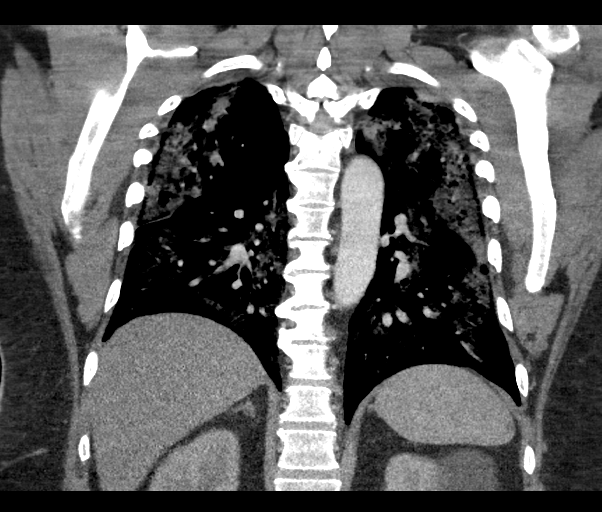

[18 of 46 positions shown; findings below may reference images not displayed]

FINDINGS: Cardiovascular: There is slightly suboptimal opacification of the
main pulmonary artery. No central or proximal segmental pulmonary
embolism. The heart is normal in size. No pericardial effusion or
thickening. No evidence right heart strain. There is normal
three-vessel brachiocephalic anatomy without proximal stenosis. The
thoracic aorta is normal in appearance.

Mediastinum/Nodes: No hilar, mediastinal, or axillary adenopathy.
There is a heterogeneously hypodense lesion seen within the right
thyroid lobe measuring 4.3 x 3.1 cm.

Lungs/Pleura: Extensive multifocal patchy airspace opacities are
seen throughout both lungs. There are air bronchogram seen at both
lung bases. No pleural effusion or pneumothorax.

Upper Abdomen: No acute abnormalities present in the visualized
portions of the upper abdomen.

Musculoskeletal: No chest wall abnormality. No acute or significant
osseous findings.

Review of the MIP images confirms the above findings.
IMPRESSION: Slightly suboptimal opacification of the main pulmonary artery,
however no central or proximal segmental pulmonary embolism.

Extensive multifocal airspace opacities, consistent with atypical
viral pneumonia

Heterogeneous right thyroid lobe lesion measuring 4.3 x 3.1 cm.
Recommend thyroid US (ref: [HOSPITAL]. [DATE]):

## 2022-03-22 IMAGING — DX DG CHEST 1V PORT
1 series · 1 of 1 positions shown · non-contrast
Comparison: One day prior

CLINICAL DATA: Pneumothorax

EXAM:
PORTABLE CHEST 1 VIEW

[chest ap]
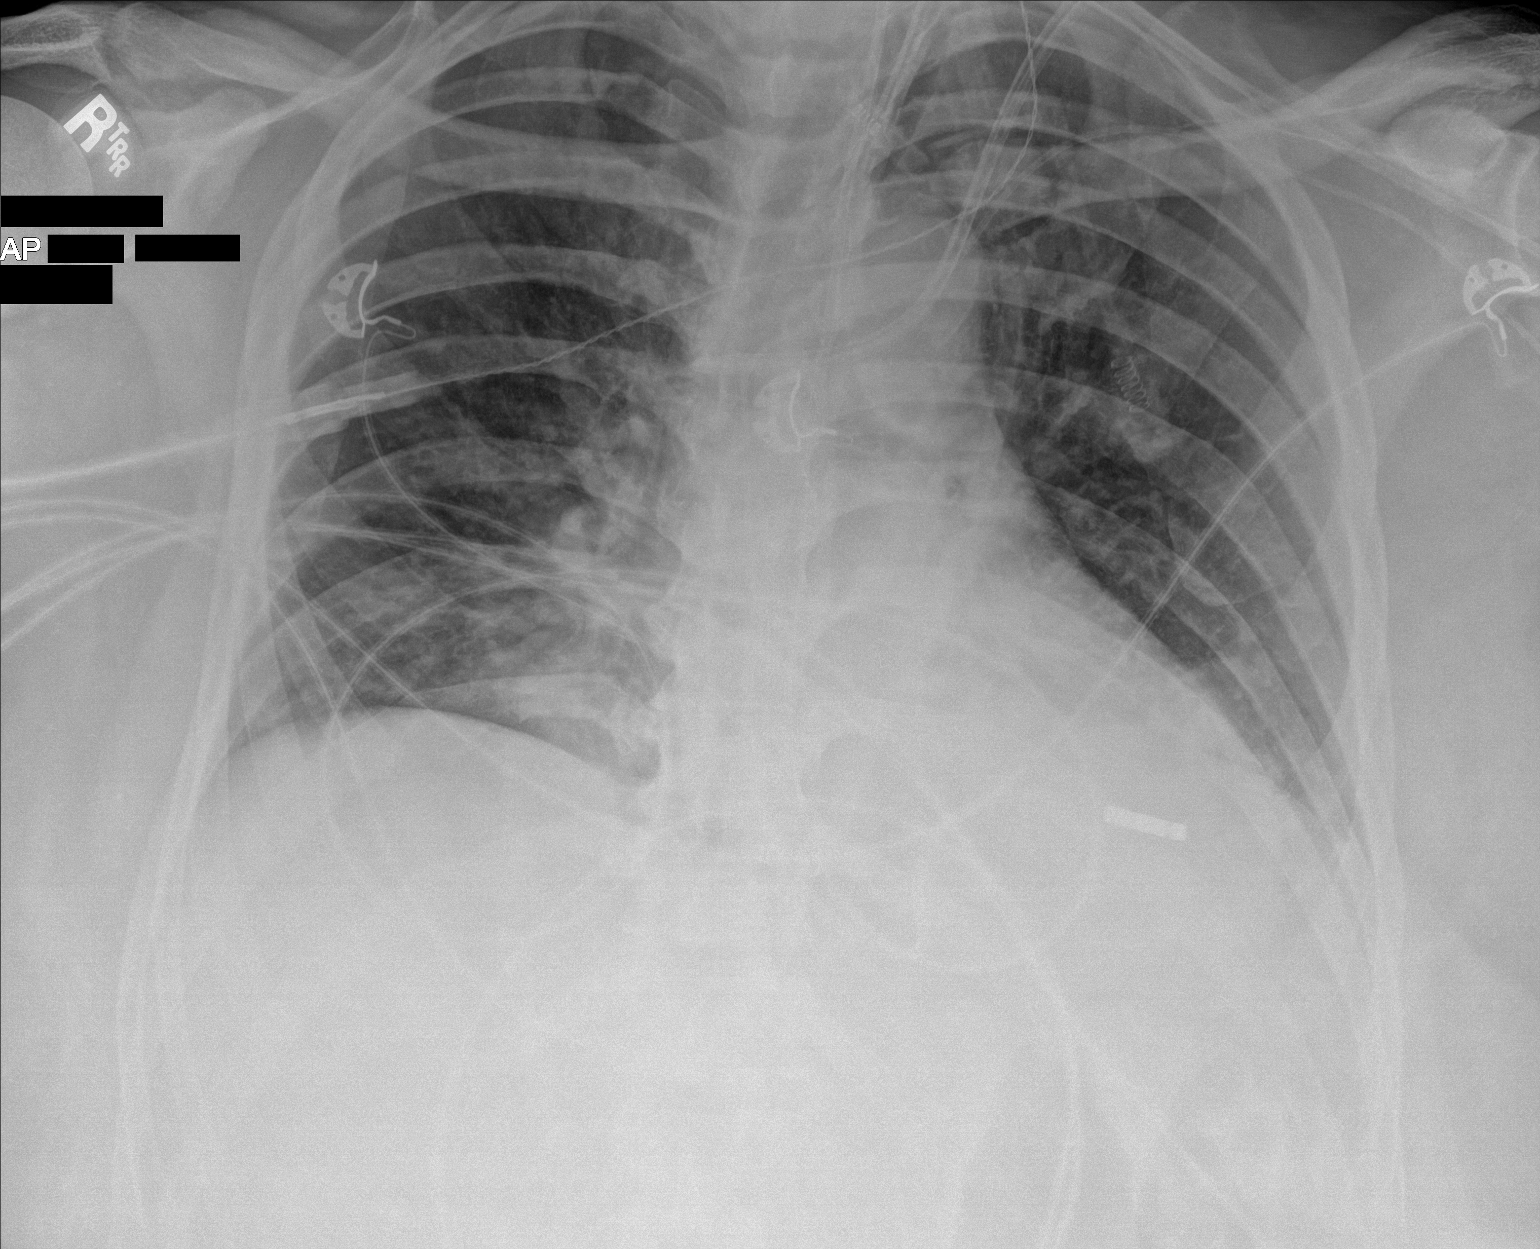

[1 of 1 positions shown; findings below may reference images not displayed]

FINDINGS: Patient rotated to the left. The superior most aspect of the chest
is excluded. Numerous leads and wires project over the chest.

Left internal jugular line tip at high SVC. Mild motion degradation.
Feeding tube terminates over the left upper quadrant, possibly
within the gastric fundus. More superiorly positioned today than on
the prior.

Endotracheal tube is poorly visualized and evaluated.

Mild cardiomegaly. No pleural fluid. No convincing evidence of
residual right sided pneumothorax, given minimal exclusion of the
upper chest. Low lung volumes with persistent bibasilar airspace
disease. Suspect mild pulmonary venous congestion.
IMPRESSION: Similar aeration, with low lung volumes, bibasilar airspace disease,
and probable pulmonary venous congestion.

Although the superior most aspect of the chest is minimally
excluded, there is no convincing evidence of residual right-sided
pneumothorax.

Support apparatus suboptimally evaluated, as detailed above. Feeding
tube terminates over the left upper quadrant, possibly within the
gastric fundus. Consider dedicated abdominal radiograph.

## 2022-03-23 IMAGING — DX DG CHEST 1V PORT
1 series · 1 of 1 positions shown · non-contrast
Comparison: Yesterday

CLINICAL DATA: Acute respiratory failure.  COVID.

EXAM:
PORTABLE CHEST 1 VIEW

[chest ap]
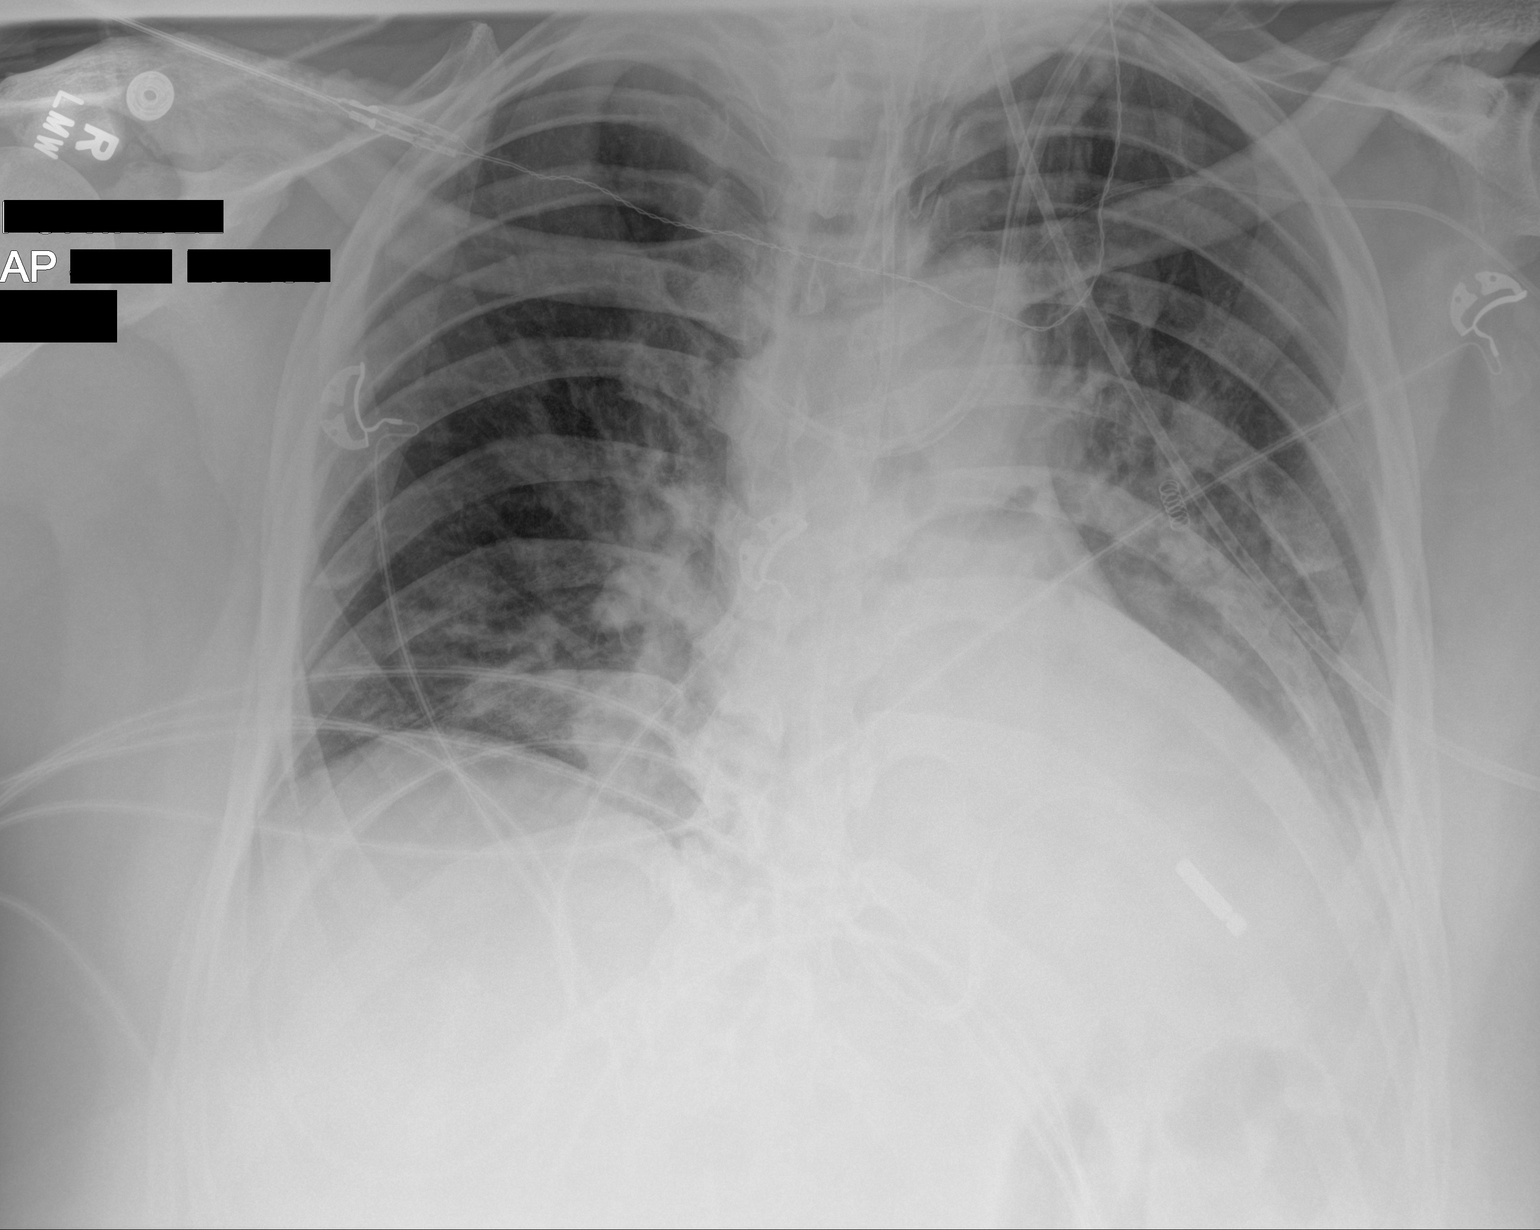

[1 of 1 positions shown; findings below may reference images not displayed]

FINDINGS: Endotracheal tube with tip at the clavicular heads. Left-sided
central line with tip near the SVC brachiocephalic confluence.
Feeding tube with tip over the left base, unchanged and presumably
at the gastric fundus. The tip of the tube projects superior to the
splenic flexure. Multifocal pneumonia with more prominent left
perihilar opacity. No visible effusion or pneumothorax. Stable
generous heart size
IMPRESSION: 1. Stable hardware positioning.
2. Multifocal pneumonia with increased left perihilar density.

## 2022-03-23 IMAGING — US US EXTREM  UP VENOUS*L*
1 series · 13 of 24 positions shown · non-contrast
Comparison: None.

CLINICAL DATA: Left upper extremity edema. History left upper
extremity approach PICC line as well as left jugular approach
dialysis catheter. Evaluate for DVT.



[Series 1: us extrem up venous*left* · 0.08mm/px · 13 of 31 slices shown]
[im 1/31]
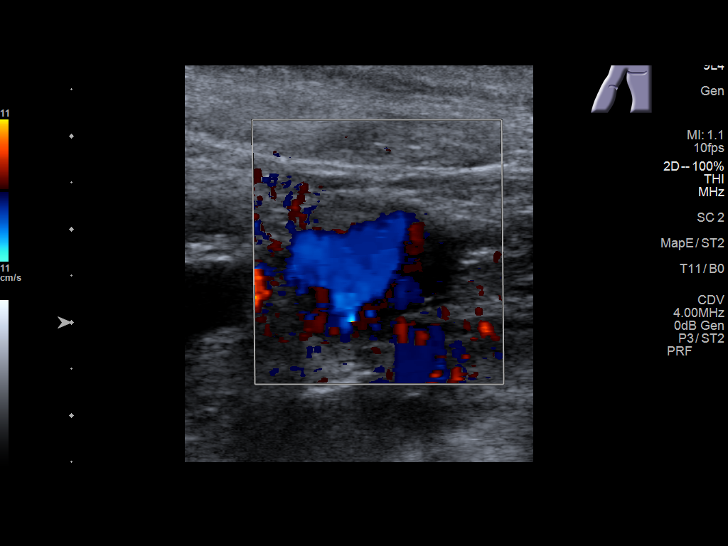
[im 3/31]
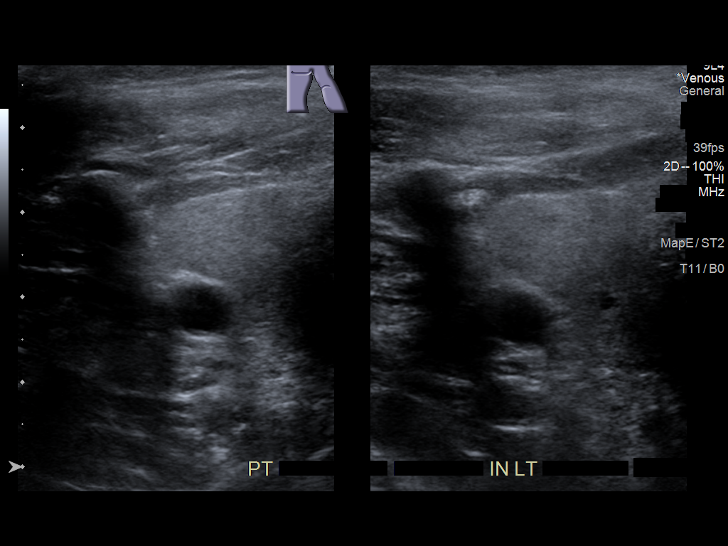
[im 6/31]
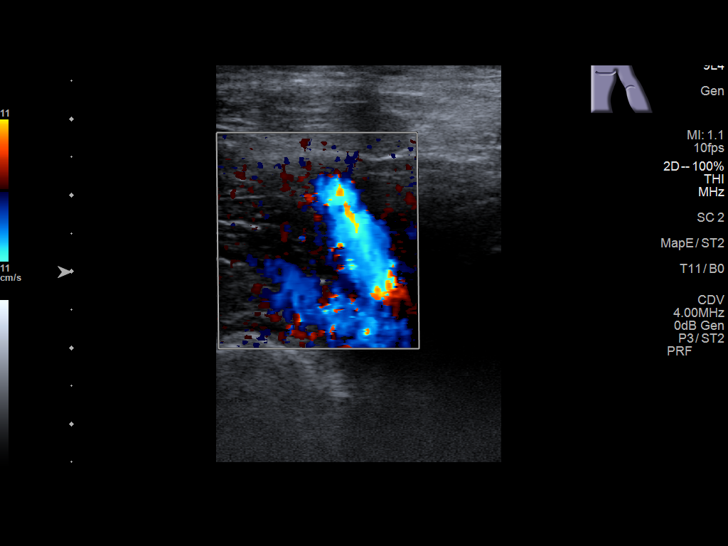
[im 8/31]
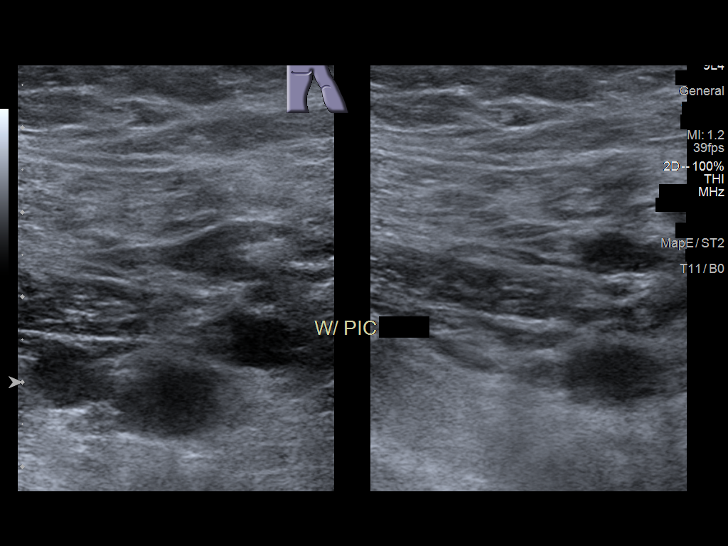
[im 11/31]
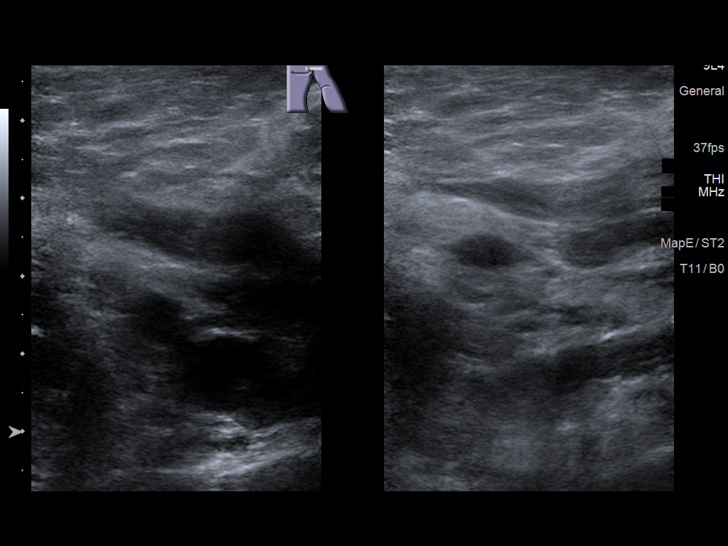
[im 14/31]
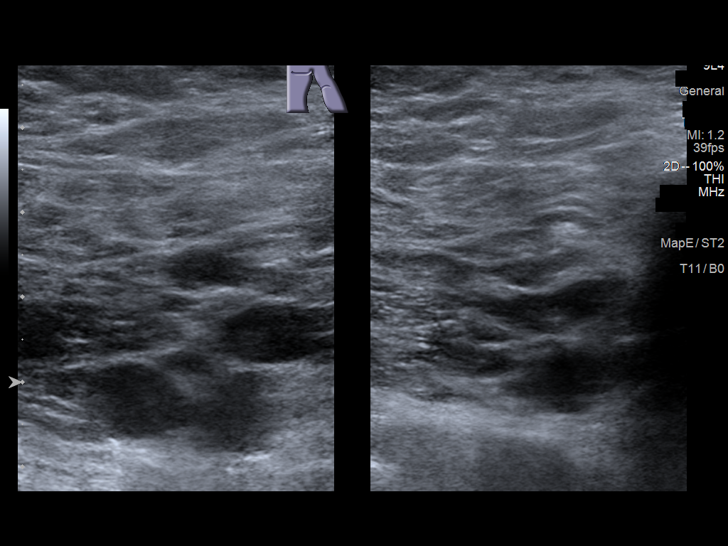
[im 16/31]
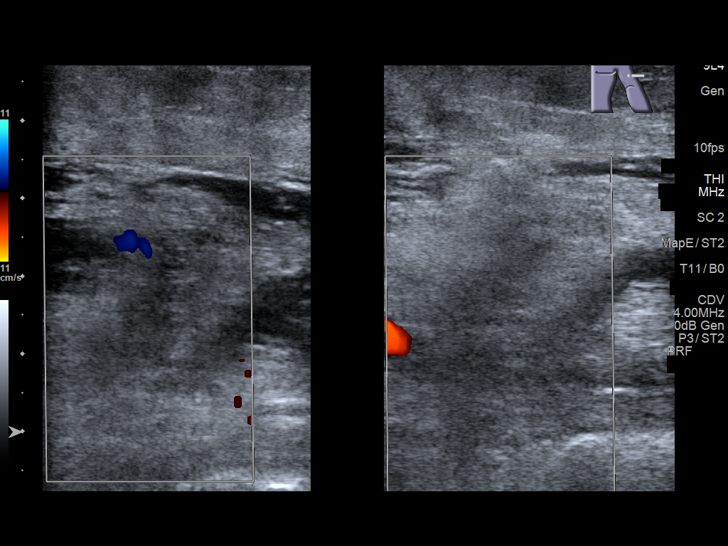
[im 17/31]
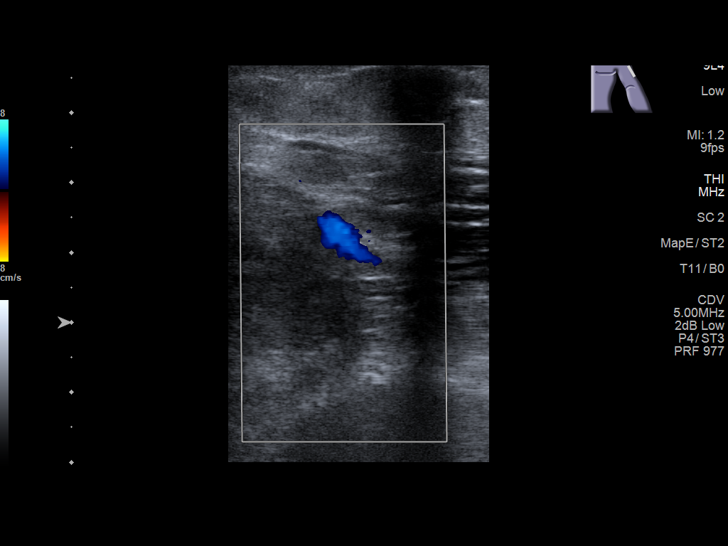
[im 20/31]
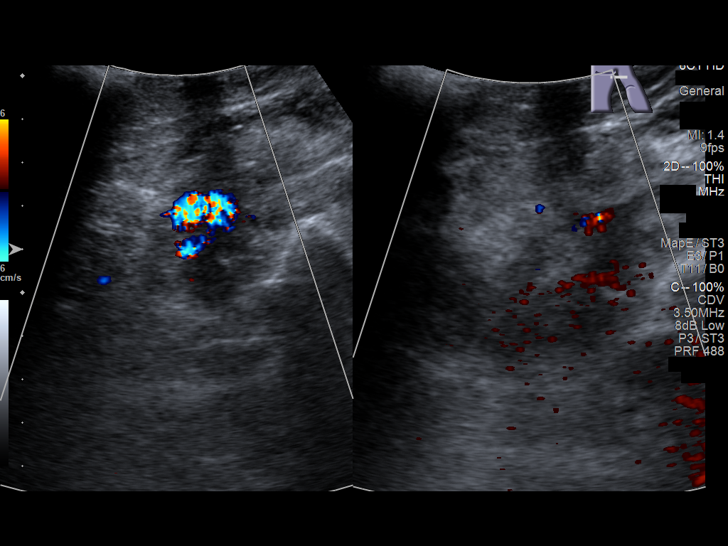
[im 23/31]
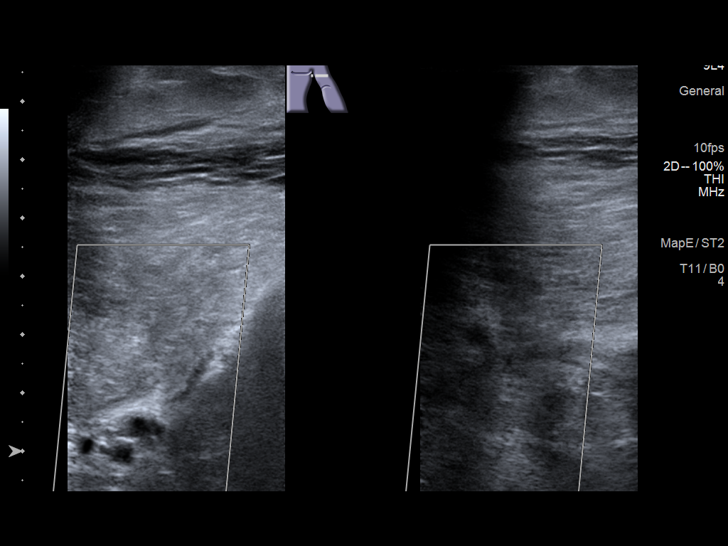
[im 25/31]
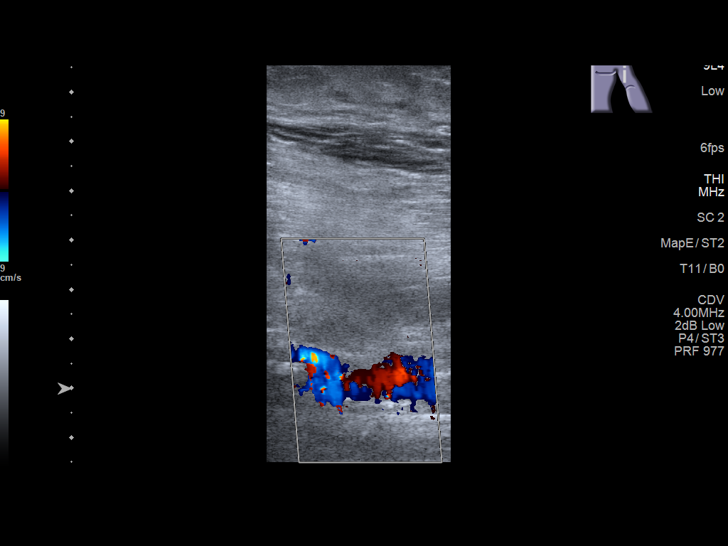
[im 28/31]
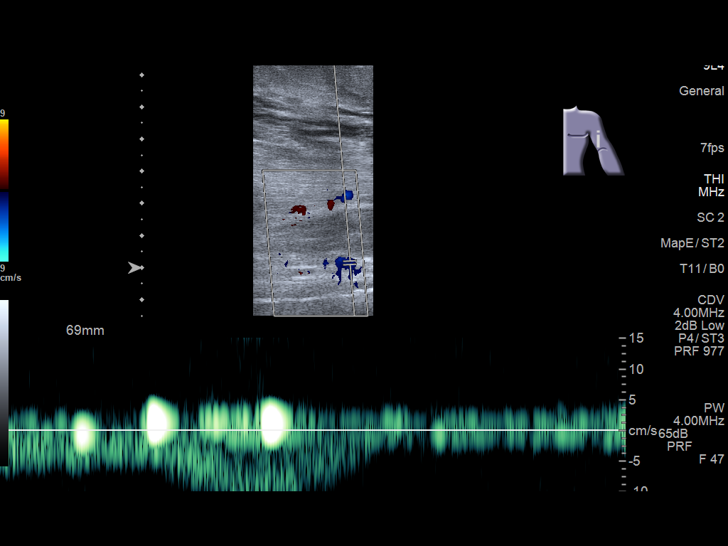
[im 31/31]
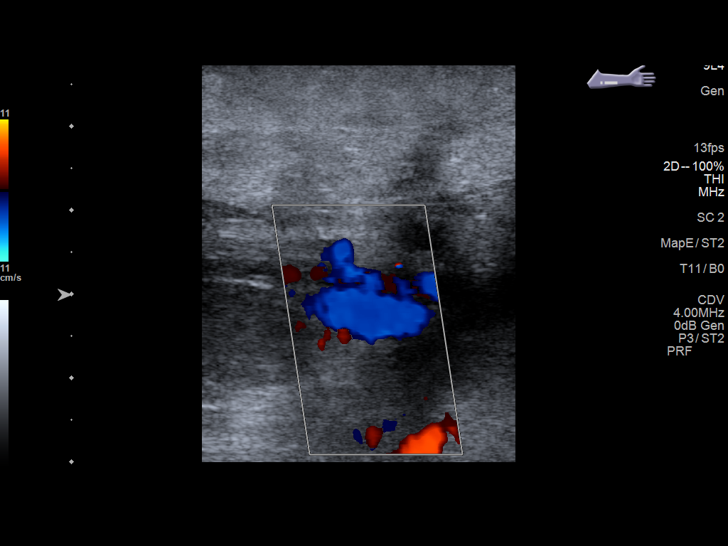

[13 of 24 positions shown; findings below may reference images not displayed]

FINDINGS: Contralateral Subclavian Vein: Respiratory phasicity is normal and
symmetric with the symptomatic side. No evidence of thrombus. Normal
compressibility.

Internal Jugular Vein: No evidence of thrombus. Normal
compressibility, respiratory phasicity and response to augmentation.

Subclavian Vein: No evidence of thrombus. Normal compressibility,
respiratory phasicity and response to augmentation.

Axillary Vein: No evidence of thrombus. Normal compressibility,
respiratory phasicity and response to augmentation.

Cephalic Vein: No evidence of thrombus. Normal compressibility,
respiratory phasicity and response to augmentation.

Basilic Vein: No evidence of thrombus. Normal compressibility,
respiratory phasicity and response to augmentation.

Brachial Veins: No evidence of thrombus. Normal compressibility,
respiratory phasicity and response to augmentation.

Radial Veins: No evidence of thrombus. Normal compressibility,
respiratory phasicity and response to augmentation.

Ulnar Veins: No evidence of thrombus. Normal compressibility,
respiratory phasicity and response to augmentation.

Venous Reflux:  None visualized.

Other Findings:  None visualized.
IMPRESSION: No evidence of DVT within the left upper extremity.
